# Patient Record
Sex: Female | Born: 1975 | Race: Black or African American | Hispanic: No | Marital: Single | Smoking: Current every day smoker
Health system: Southern US, Community
[De-identification: ages and names within clinical notes are randomized; demographics above are authoritative.]

## PROBLEM LIST (undated history)

## (undated) DIAGNOSIS — I10 Essential (primary) hypertension: Secondary | ICD-10-CM

## (undated) DIAGNOSIS — F17200 Nicotine dependence, unspecified, uncomplicated: Secondary | ICD-10-CM

## (undated) DIAGNOSIS — F1490 Cocaine use, unspecified, uncomplicated: Secondary | ICD-10-CM

## (undated) HISTORY — DX: Essential (primary) hypertension: I10

## (undated) HISTORY — DX: Cocaine use, unspecified, uncomplicated: F14.90

## (undated) HISTORY — DX: Nicotine dependence, unspecified, uncomplicated: F17.200

## (undated) HISTORY — PX: TUBAL LIGATION: SHX77

---

## 1998-01-18 ENCOUNTER — Emergency Department (HOSPITAL_COMMUNITY): Admission: EM | Admit: 1998-01-18 | Discharge: 1998-01-18 | Payer: Self-pay | Admitting: Emergency Medicine

## 1998-06-04 ENCOUNTER — Emergency Department (HOSPITAL_COMMUNITY): Admission: EM | Admit: 1998-06-04 | Discharge: 1998-06-04 | Payer: Self-pay | Admitting: Emergency Medicine

## 1998-11-22 ENCOUNTER — Emergency Department (HOSPITAL_COMMUNITY): Admission: EM | Admit: 1998-11-22 | Discharge: 1998-11-22 | Payer: Self-pay | Admitting: Emergency Medicine

## 1998-11-24 ENCOUNTER — Emergency Department (HOSPITAL_COMMUNITY): Admission: EM | Admit: 1998-11-24 | Discharge: 1998-11-25 | Payer: Self-pay | Admitting: Emergency Medicine

## 1998-11-26 ENCOUNTER — Emergency Department (HOSPITAL_COMMUNITY): Admission: EM | Admit: 1998-11-26 | Discharge: 1998-11-26 | Payer: Self-pay | Admitting: Emergency Medicine

## 1999-02-04 ENCOUNTER — Inpatient Hospital Stay (HOSPITAL_COMMUNITY): Admission: AD | Admit: 1999-02-04 | Discharge: 1999-02-04 | Payer: Self-pay | Admitting: Obstetrics

## 1999-02-12 ENCOUNTER — Other Ambulatory Visit: Admission: RE | Admit: 1999-02-12 | Discharge: 1999-02-12 | Payer: Self-pay | Admitting: Obstetrics and Gynecology

## 1999-04-21 ENCOUNTER — Inpatient Hospital Stay (HOSPITAL_COMMUNITY): Admission: AD | Admit: 1999-04-21 | Discharge: 1999-04-21 | Payer: Self-pay | Admitting: *Deleted

## 1999-05-26 ENCOUNTER — Inpatient Hospital Stay (HOSPITAL_COMMUNITY): Admission: AD | Admit: 1999-05-26 | Discharge: 1999-05-26 | Payer: Self-pay | Admitting: *Deleted

## 1999-06-21 ENCOUNTER — Emergency Department (HOSPITAL_COMMUNITY): Admission: EM | Admit: 1999-06-21 | Discharge: 1999-06-21 | Payer: Self-pay | Admitting: Emergency Medicine

## 1999-07-18 ENCOUNTER — Inpatient Hospital Stay (HOSPITAL_COMMUNITY): Admission: AD | Admit: 1999-07-18 | Discharge: 1999-07-18 | Payer: Self-pay | Admitting: Obstetrics

## 1999-09-15 ENCOUNTER — Inpatient Hospital Stay (HOSPITAL_COMMUNITY): Admission: AD | Admit: 1999-09-15 | Discharge: 1999-09-15 | Payer: Self-pay | Admitting: Obstetrics and Gynecology

## 1999-09-19 ENCOUNTER — Inpatient Hospital Stay (HOSPITAL_COMMUNITY): Admission: AD | Admit: 1999-09-19 | Discharge: 1999-09-19 | Payer: Self-pay | Admitting: Obstetrics & Gynecology

## 1999-09-21 ENCOUNTER — Inpatient Hospital Stay (HOSPITAL_COMMUNITY): Admission: AD | Admit: 1999-09-21 | Discharge: 1999-09-28 | Payer: Self-pay | Admitting: Obstetrics & Gynecology

## 1999-09-26 ENCOUNTER — Encounter: Payer: Self-pay | Admitting: Obstetrics and Gynecology

## 1999-12-03 ENCOUNTER — Encounter: Payer: Self-pay | Admitting: Emergency Medicine

## 1999-12-03 ENCOUNTER — Emergency Department (HOSPITAL_COMMUNITY): Admission: EM | Admit: 1999-12-03 | Discharge: 1999-12-03 | Payer: Self-pay | Admitting: Emergency Medicine

## 2000-02-14 ENCOUNTER — Inpatient Hospital Stay (HOSPITAL_COMMUNITY): Admission: AD | Admit: 2000-02-14 | Discharge: 2000-02-14 | Payer: Self-pay | Admitting: *Deleted

## 2000-03-09 ENCOUNTER — Emergency Department (HOSPITAL_COMMUNITY): Admission: EM | Admit: 2000-03-09 | Discharge: 2000-03-09 | Payer: Self-pay | Admitting: Emergency Medicine

## 2000-07-09 ENCOUNTER — Inpatient Hospital Stay (HOSPITAL_COMMUNITY): Admission: AD | Admit: 2000-07-09 | Discharge: 2000-07-09 | Payer: Self-pay | Admitting: Obstetrics & Gynecology

## 2001-07-06 ENCOUNTER — Inpatient Hospital Stay (HOSPITAL_COMMUNITY): Admission: AD | Admit: 2001-07-06 | Discharge: 2001-07-06 | Payer: Self-pay | Admitting: Obstetrics

## 2001-10-31 ENCOUNTER — Inpatient Hospital Stay (HOSPITAL_COMMUNITY): Admission: AD | Admit: 2001-10-31 | Discharge: 2001-10-31 | Payer: Self-pay | Admitting: Obstetrics

## 2001-11-18 ENCOUNTER — Inpatient Hospital Stay (HOSPITAL_COMMUNITY): Admission: AD | Admit: 2001-11-18 | Discharge: 2001-11-18 | Payer: Self-pay | Admitting: *Deleted

## 2001-12-26 ENCOUNTER — Other Ambulatory Visit: Admission: RE | Admit: 2001-12-26 | Discharge: 2001-12-26 | Payer: Self-pay | Admitting: Gynecology

## 2002-01-09 ENCOUNTER — Inpatient Hospital Stay (HOSPITAL_COMMUNITY): Admission: AD | Admit: 2002-01-09 | Discharge: 2002-01-10 | Payer: Self-pay | Admitting: Obstetrics & Gynecology

## 2002-01-09 ENCOUNTER — Encounter: Payer: Self-pay | Admitting: Obstetrics & Gynecology

## 2002-03-31 ENCOUNTER — Inpatient Hospital Stay (HOSPITAL_COMMUNITY): Admission: AD | Admit: 2002-03-31 | Discharge: 2002-03-31 | Payer: Self-pay | Admitting: Obstetrics and Gynecology

## 2002-04-07 ENCOUNTER — Inpatient Hospital Stay (HOSPITAL_COMMUNITY): Admission: AD | Admit: 2002-04-07 | Discharge: 2002-04-07 | Payer: Self-pay | Admitting: Obstetrics and Gynecology

## 2002-04-07 ENCOUNTER — Encounter: Payer: Self-pay | Admitting: Obstetrics and Gynecology

## 2002-05-16 ENCOUNTER — Observation Stay (HOSPITAL_COMMUNITY): Admission: AD | Admit: 2002-05-16 | Discharge: 2002-05-17 | Payer: Self-pay | Admitting: Obstetrics & Gynecology

## 2002-05-27 ENCOUNTER — Inpatient Hospital Stay (HOSPITAL_COMMUNITY): Admission: AD | Admit: 2002-05-27 | Discharge: 2002-05-27 | Payer: Self-pay | Admitting: Obstetrics & Gynecology

## 2002-06-02 ENCOUNTER — Observation Stay (HOSPITAL_COMMUNITY): Admission: AD | Admit: 2002-06-02 | Discharge: 2002-06-02 | Payer: Self-pay | Admitting: Obstetrics and Gynecology

## 2002-06-11 ENCOUNTER — Inpatient Hospital Stay (HOSPITAL_COMMUNITY): Admission: AD | Admit: 2002-06-11 | Discharge: 2002-06-11 | Payer: Self-pay | Admitting: Obstetrics and Gynecology

## 2002-06-17 ENCOUNTER — Inpatient Hospital Stay (HOSPITAL_COMMUNITY): Admission: AD | Admit: 2002-06-17 | Discharge: 2002-06-17 | Payer: Self-pay | Admitting: Obstetrics and Gynecology

## 2002-06-27 ENCOUNTER — Inpatient Hospital Stay (HOSPITAL_COMMUNITY): Admission: AD | Admit: 2002-06-27 | Discharge: 2002-07-01 | Payer: Self-pay | Admitting: Obstetrics and Gynecology

## 2002-09-06 ENCOUNTER — Encounter: Payer: Self-pay | Admitting: Emergency Medicine

## 2002-09-06 ENCOUNTER — Emergency Department (HOSPITAL_COMMUNITY): Admission: EM | Admit: 2002-09-06 | Discharge: 2002-09-06 | Payer: Self-pay | Admitting: Emergency Medicine

## 2004-11-27 ENCOUNTER — Emergency Department (HOSPITAL_COMMUNITY): Admission: EM | Admit: 2004-11-27 | Discharge: 2004-11-27 | Payer: Self-pay | Admitting: Emergency Medicine

## 2005-03-21 ENCOUNTER — Inpatient Hospital Stay (HOSPITAL_COMMUNITY): Admission: AD | Admit: 2005-03-21 | Discharge: 2005-03-21 | Payer: Self-pay | Admitting: Obstetrics

## 2005-07-05 ENCOUNTER — Inpatient Hospital Stay (HOSPITAL_COMMUNITY): Admission: AD | Admit: 2005-07-05 | Discharge: 2005-07-05 | Payer: Self-pay | Admitting: Obstetrics & Gynecology

## 2005-07-07 ENCOUNTER — Inpatient Hospital Stay (HOSPITAL_COMMUNITY): Admission: AD | Admit: 2005-07-07 | Discharge: 2005-07-07 | Payer: Self-pay | Admitting: *Deleted

## 2005-08-15 ENCOUNTER — Inpatient Hospital Stay (HOSPITAL_COMMUNITY): Admission: AD | Admit: 2005-08-15 | Discharge: 2005-08-15 | Payer: Self-pay | Admitting: *Deleted

## 2005-09-26 ENCOUNTER — Emergency Department (HOSPITAL_COMMUNITY): Admission: EM | Admit: 2005-09-26 | Discharge: 2005-09-26 | Payer: Self-pay | Admitting: Emergency Medicine

## 2006-02-17 ENCOUNTER — Encounter (INDEPENDENT_AMBULATORY_CARE_PROVIDER_SITE_OTHER): Payer: Self-pay | Admitting: Specialist

## 2006-02-17 ENCOUNTER — Ambulatory Visit: Payer: Self-pay | Admitting: Obstetrics & Gynecology

## 2006-05-12 ENCOUNTER — Encounter (INDEPENDENT_AMBULATORY_CARE_PROVIDER_SITE_OTHER): Payer: Self-pay | Admitting: Specialist

## 2006-05-12 ENCOUNTER — Other Ambulatory Visit: Admission: RE | Admit: 2006-05-12 | Discharge: 2006-05-12 | Payer: Self-pay | Admitting: Obstetrics and Gynecology

## 2006-05-12 ENCOUNTER — Ambulatory Visit: Payer: Self-pay | Admitting: Obstetrics and Gynecology

## 2006-05-30 ENCOUNTER — Emergency Department (HOSPITAL_COMMUNITY): Admission: EM | Admit: 2006-05-30 | Discharge: 2006-05-30 | Payer: Self-pay | Admitting: Emergency Medicine

## 2006-06-02 ENCOUNTER — Ambulatory Visit: Payer: Self-pay | Admitting: Obstetrics and Gynecology

## 2006-09-10 ENCOUNTER — Inpatient Hospital Stay (HOSPITAL_COMMUNITY): Admission: AD | Admit: 2006-09-10 | Discharge: 2006-09-10 | Payer: Self-pay | Admitting: Obstetrics & Gynecology

## 2007-02-05 ENCOUNTER — Emergency Department (HOSPITAL_COMMUNITY): Admission: EM | Admit: 2007-02-05 | Discharge: 2007-02-05 | Payer: Self-pay | Admitting: Emergency Medicine

## 2007-06-09 ENCOUNTER — Ambulatory Visit: Payer: Self-pay | Admitting: Obstetrics & Gynecology

## 2007-06-09 ENCOUNTER — Encounter (INDEPENDENT_AMBULATORY_CARE_PROVIDER_SITE_OTHER): Payer: Self-pay | Admitting: Gynecology

## 2007-07-01 ENCOUNTER — Ambulatory Visit (HOSPITAL_BASED_OUTPATIENT_CLINIC_OR_DEPARTMENT_OTHER): Admission: RE | Admit: 2007-07-01 | Discharge: 2007-07-02 | Payer: Self-pay | Admitting: Orthopedic Surgery

## 2007-07-06 ENCOUNTER — Encounter: Admission: RE | Admit: 2007-07-06 | Discharge: 2007-07-27 | Payer: Self-pay | Admitting: Orthopedic Surgery

## 2007-07-28 ENCOUNTER — Ambulatory Visit (HOSPITAL_COMMUNITY): Admission: RE | Admit: 2007-07-28 | Discharge: 2007-07-28 | Payer: Self-pay | Admitting: Obstetrics & Gynecology

## 2007-07-28 ENCOUNTER — Inpatient Hospital Stay (HOSPITAL_COMMUNITY): Admission: AD | Admit: 2007-07-28 | Discharge: 2007-07-28 | Payer: Self-pay | Admitting: Obstetrics and Gynecology

## 2007-07-28 ENCOUNTER — Inpatient Hospital Stay (HOSPITAL_COMMUNITY): Admission: AD | Admit: 2007-07-28 | Discharge: 2007-07-28 | Payer: Self-pay | Admitting: Obstetrics & Gynecology

## 2007-11-02 ENCOUNTER — Inpatient Hospital Stay (HOSPITAL_COMMUNITY): Admission: AD | Admit: 2007-11-02 | Discharge: 2007-11-02 | Payer: Self-pay | Admitting: Obstetrics

## 2008-01-24 ENCOUNTER — Inpatient Hospital Stay (HOSPITAL_COMMUNITY): Admission: AD | Admit: 2008-01-24 | Discharge: 2008-01-26 | Payer: Self-pay | Admitting: Obstetrics

## 2008-01-27 ENCOUNTER — Inpatient Hospital Stay (HOSPITAL_COMMUNITY): Admission: AD | Admit: 2008-01-27 | Discharge: 2008-01-27 | Payer: Self-pay | Admitting: Obstetrics

## 2008-02-22 ENCOUNTER — Inpatient Hospital Stay (HOSPITAL_COMMUNITY): Admission: AD | Admit: 2008-02-22 | Discharge: 2008-02-26 | Payer: Self-pay | Admitting: Obstetrics

## 2008-02-23 ENCOUNTER — Encounter (INDEPENDENT_AMBULATORY_CARE_PROVIDER_SITE_OTHER): Payer: Self-pay | Admitting: Obstetrics

## 2010-09-26 ENCOUNTER — Emergency Department (HOSPITAL_COMMUNITY)
Admission: EM | Admit: 2010-09-26 | Discharge: 2010-09-27 | Payer: Self-pay | Source: Home / Self Care | Admitting: Emergency Medicine

## 2010-11-02 ENCOUNTER — Encounter: Payer: Self-pay | Admitting: Obstetrics

## 2010-11-02 ENCOUNTER — Encounter: Payer: Self-pay | Admitting: Obstetrics & Gynecology

## 2011-02-24 NOTE — H&P (Signed)
[name], [name]               ACCOUNT NO.:  [id number]   MEDICAL RECORD NO.:  [id number]          PATIENT TYPE:  INP   LOCATION:  9371                          FACILITY:  [facility]   PHYSICIAN:  Kathreen Cosier, M.D.DATE OF BIRTH:  Feb 21, 1976   DATE OF ADMISSION:  02/22/2008  DATE OF DISCHARGE:                              HISTORY & PHYSICAL   HISTORY OF PRESENT ILLNESS:  The patient is a 35 year old gravida 3,  para 2-0-0-2, had two previous C-sections.  EDC was March 20, 2008.  The  patient was 36 weeks and 4 days, who came in complaining of a headache.  Her blood pressures were elevated, has a diastolic 101.  The patient had  a history of preeclampsia, in the past both pregnancies, and in this  pregnancy she was placed on Aldomet 500 mg p.o. b.i.d., but the patient  states that she had not been taking the medication, and she came in with  a headache and elevated blood pressures.  She was started on magnesium  sulfate, 4 g loading, 2 g an hour.  Her urine protein was 100.  Her PIH  labs were normal.  Her sodium was 138, potassium on admission was 2.6,  chloride 106, glucose 97, BUN less than 1, and creatinine 0.47 and her  SGOT and SGPT normal.  LDH 153.  Platelets 288 and hemoglobin 9.9.  Uric  acid 6.1.  Overnight, the repeat labs on Feb 23, 2008 were essentially  unchanged, and her magnesium was discontinued, but her blood pressures  became elevated once again, so it  was decided she would be delivered by  repeat section.  The patient also desires of a tubal ligation.   PHYSICAL EXAM:  GENERAL:  Revealed a well-developed female in no  distress.  HEENT:  Negative.  LUNGS:  Clear.  HEART:  Regular rhythm.  No murmurs or gallops.  BREASTS:  No masses.  ABDOMEN:  36 weeks size.  Estimated fetal weight 6-1/2 pounds.  EXTREMITIES:  Negative.   ADMISSION DIAGNOSES:  1. Pregnancy-induced hypertension.  2. Chronic hypertension.           ______________________________  Kathreen Cosier, M.D.     BAM/MEDQ  D:  02/23/2008  T:  02/24/2008  Job:  [id number]

## 2011-02-24 NOTE — Op Note (Signed)
NAMEAMARISS, DETAMORE               ACCOUNT NO.:  [id number]   MEDICAL RECORD NO.:  [id number]          PATIENT TYPE:  AMB   LOCATION:  DSC                          FACILITY:  [facility]   PHYSICIAN:  Dyke Brackett, M.D.    DATE OF BIRTH:  1976-08-29   DATE OF PROCEDURE:  07/01/2007  DATE OF DISCHARGE:                               OPERATIVE REPORT   INDICATIONS:  She is a 35 year old female who was followed elsewhere  with previous surgery. MRI proven lateral meniscus tear with ACL  insufficiency thought to be amenable to outpatient surgery.   PREOPERATIVE DIAGNOSES:  1. Large double bucket-handle tear lateral meniscus.  2. Chondromalacia medial lateral patellofemoral joint.  3. Chronic anterior cruciate ligament insufficiency.   POSTOPERATIVE DIAGNOSES:  1. Large double bucket-handle tear lateral meniscus.  2. Chondromalacia medial lateral patellofemoral joint.  3. Chronic anterior cruciate ligament insufficiency.   OPERATION:  1. Bone-tendon-bone allograft ACL reconstruction.  2. Arthroscopic meniscectomy.  3. Arthroscopic debridement chondroplasty x2.   SURGEON:  Dr. Madelon Lips.   ANESTHESIA:  General with a nerve block with some local supplementation.   DESCRIPTION OF PROCEDURE:  Arthroscopic portals created inferomedial,  inferolateral and essentially went through the patellar tendon for  placement of the ACL screw. Systematic inspection of the knee showed the  patient to have mild-to-moderate changes of chondromalacia,  tricompartmental, which were debrided most noticeably in the lateral  compartment, previous partial probably 50% meniscectomy on the medial  aspect of the knee and a double bucket-handle tear of the lateral  requiring resection of probably 80-90% of the lateral meniscus notch  showed moderate overgrowth, notchplasty was carried out. A 100-105 mm  bone-tendon-bone allograft was prepared at the back table. We had a  notchplasty carried out followed by placement  of the tibial guide pin  near the anatomic attachment of the ACL reference of the PCL with the  Arthrex system.  This was followed by placement of a guide pin using the  Arthrex system through the transtibial hole with a guide pin placed at  about the 1 o'clock position of the posterior femur for the left knee.  A footprint was created followed by over reaming with a 10-mm reamer as  well as tibia and on the femur and a notch had been placed in the hole  relative to a replacement guide pin on the femur.  Graft was applied  from tibial to femoral side without difficulty, good snug fit was  obtained, the patient's bone was moderately soft, the plug of the donor  very hard bone. Good press fit was obtained with a 7 x 25 mm  screw placed on each side.  Again no impingement noted, excellent  stability allotment which was definitely 4+ and was traced post  insertion of the graft and the screws. The wounds were closed with 2-0  Vicryl and skin with interrupted Vicryl, Marcaine with epinephrine  infiltrated in  the skin.  Taken tr recovery room in stable condition.      Dyke Brackett, M.D.  Electronically Signed     WDC/MEDQ  D:  07/01/2007  T:  07/02/2007  Job:  [id number]

## 2011-02-24 NOTE — Group Therapy Note (Signed)
[name], [name]               ACCOUNT NO.:  [id number]   MEDICAL RECORD NO.:  [id number]         PATIENT TYPE:  WOC   LOCATION:  [facility]                   FACILITY:  [facility]   PHYSICIAN:  Johnella Moloney, MD        DATE OF BIRTH:  1975-10-26   DATE OF SERVICE:                                  CLINIC NOTE   CHIEF COMPLAINT:  Yearly exam, itching in vaginal area.   HISTORY OF PRESENT ILLNESS:  The patient is a 35 year old G5 P2 0 3 2,  who presents for her annual exam.  The patient also complains of itching  in her vaginal area.  She says that she noticed itching two weeks ago.  It is mostly external, not associated with any rashes or discharge or  foul odor.  She just wanted to see if there was anything that was  abnormal.  The patient also reported having one episode of dyspareunia  recently that lasted for 15 to 20 minutes, but no other associated  symptoms.  Of note, the patient's last menstrual period was April 26, 2007, and she is also requesting a urine pregnancy test.   PAST OB HISTORY:  G5 P2 0 3 2, 2 cesarean sections, 2 abortions and 1  miscarriage.   PAST GYN HISTORY:  Normal menstrual periods.  History of cervical  dysplasia with ascus positive HPV Pap smear in May 2007 and colposcopy  that showed low-grade squamous intraepithelial lesion.  The patient also  has a history of gonorrhea and Chlamydia, which she says is about in  1998 and she and her partner were treated.   PAST MEDICAL HISTORY:  None.   PAST SURGICAL HISTORY:  None.   MEDICATIONS:  None.   ALLERGIES:  None.   SOCIAL HISTORY:  The patient works as a [employer] manager.  Lives  with her children and also with her partner.  She smokes half a pack per  day since age 41.  Denies alcohol or illicit drug use.   FAMILY HISTORY:  Noncontributory.   14-POINT REVIEW OF SYSTEMS:  Patient does report having occasional  headaches and has noticed that she has high blood pressures.  She will  follow up  with her primary care Abbey Veith for further evaluation and  management of these symptoms.   PHYSICAL EXAMINATION:  VITAL SIGNS:  Temperature 99.5, pulse 75, blood  pressure 145/104.  Recheck was 142/97, weight 161 pounds, height 5 7.  GENERAL:  No apparent distress.  LUNGS:  Clear to auscultation bilaterally.  HEART:  Regular rate and rhythm.  ABDOMEN:  Soft, nontender, nondistended.  PELVIC EXAMINATION:  Normal external female genitalia.  No vulvar  lesions or abnormal discharge noted.  Normal vaginal discharge with  clear normal rugae.  Cervix:  Normal contour.  Retroverted uterus of  normal size and mobile.  Nontender.  No abnormal adnexal masses.  Sample  obtained for Pap smear, G6 Chlamydia and wet prep.   ASSESSMENT AND PLAN:  The patient is a 35 year old female who came here  for annual exam.  Will follow up Pap smear, given her history of  cervical dysplasia  and she might need a colposcopy if the results come  back abnormal.  The patient is also to get a urine hCG at this visit.  Will follow up the results for that and will also follow up her wet prep  results.  The patient will follow up with her PCP for evaluation and  management of her high blood pressure and recurrent headaches.           ______________________________  Johnella Moloney, MD     UD/MEDQ  D:  06/09/2007  T:  06/10/2007  Job:  [id number]

## 2011-02-24 NOTE — Discharge Summary (Signed)
[name], [name]               ACCOUNT NO.:  [id number]   MEDICAL RECORD NO.:  [id number]          PATIENT TYPE:  INP   LOCATION:  9371                          [facility]   PHYSICIAN:  Kathreen Cosier, M.D.DATE OF BIRTH:  [birth year]   DATE OF ADMISSION:  02/22/2008  DATE OF DISCHARGE:  02/26/2008                               DISCHARGE SUMMARY   The patient is a 35 year old gravida 3, para 2-0-0-2, [facility] March 20, 2008,  came in with a headache.  She had a history of preeclampsia with all of  her pregnancies and she had been on Aldomet 500 p.o. b.i.d. but she had  not been taking the medication.  On admission, her diastolic blood  pressure was 101.  She was started on magnesium sulfate 4 grams loading  2 grams an hour.  Urine protein was 100.  PIH labs were normal.  Potassium on admission was 2.6.  She was started on 40 mEq of KCl each  IV.  Chloride 106, glucose 97.  SGOT and SGPT were normal.  Platelets  288, hemoglobin 9.9.  Uric acid 6.1.  The next day, her magnesium  sulfate was discontinued.  Blood pressures became elevated once again  and she was taken to the operating room and had a repeat low-transverse  cesarean section and tubal ligation.  She had a female, Apgars of 9 and 10  with a weight of 6 pounds 2 ounces.  Postop, she was started on  labetalol p.o. b.i.d. and by postop day #2, blood pressure was normal,  magnesium was discontinued, and she was continued on her potassium  because of hyperkalemia.  By day #3, potassium was 3.1.  She was  discharged on K-Dur 20 p.o. b.i.d., Tylox for pain, and labetalol 200  p.o. b.i.d. and to see me in a week for blood pressure check.   DISCHARGE DIAGNOSES:  1. History of chronic hypertension, untreated.  The patient did not      take her medication.  2. Ruptured membranes.  3. Pregnancy-induced hypertension.           ______________________________  Kathreen Cosier, M.D.     BAM/MEDQ  D:  03/21/2008  T:   03/21/2008  Job:  [id number]

## 2011-02-24 NOTE — Op Note (Signed)
[name], [name]               ACCOUNT NO.:  [id number]   MEDICAL RECORD NO.:  [id number]          PATIENT TYPE:  INP   LOCATION:  9371                          [facility]   PHYSICIAN:  Kathreen Cosier, M.D.DATE OF BIRTH:  [birth year]   DATE OF PROCEDURE:  02/23/2008  DATE OF DISCHARGE:                               OPERATIVE REPORT   PREOPERATIVE DIAGNOSES:  1. Pregnancy-induced hypertension.  2. Chronic hypertension.  3. Previous C-section, multiparity.   POSTOPERATIVE DIAGNOSES:  1. Pregnancy-induced hypertension.  2. Chronic hypertension.  3. Previous C-section, multiparity.   ANESTHESIA:  Spinal.   PROCEDURE:  The patient was placed in the supine position.  After the  spinal administered, abdomen prepped and draped.  Bladder emptied with  Foley catheter.  Transverse suprapubic incision made through the old  scar, carried down to her rectus fascia.  Fascia cleaned and incised to  the length of the incision.  Rectus muscle was retracted laterally.  The  peritoneum was incised longitudinally.  Transverse incision made on the  visceral peritoneum, above the bladder.  Bladder mobilized inferiorly.  Transverse lower uterine incision made.  The fluid was clear.  The  patient was delivered of a female, Apgar 9 and 10, weighing 6 pounds 2  ounces.  From the LOA position, there was nuchal cord x1.  The placenta  was removed manually and sent to pathology.  Uterine cavity cleaned with  dry lap.  The uterine incision closed in one layer with continuous  suture of #1 chromic.  Hemostasis was satisfactory.  Uterus well  contracted.  The right tube was grasped in the midportion with a Babcock  clamp, 0 plain suture placed in the mesosalpinx, below the portion of  tube and the clamp.  Approximately 1 inch of tube was transected.  The  procedure was done in a similar fashion on the other side.  Hemostasis  was satisfactory.  Abdomen closed in layers; peritoneum, continuous  suture of 0 chromic; and fascia, continuous suture of 0 Dexon.  Skin  closed with subcuticular stitch of 4-0 Monocryl.  Blood loss was 500 mL.           ______________________________  Kathreen Cosier, M.D.     BAM/MEDQ  D:  02/23/2008  T:  02/24/2008  Job:  [id number]

## 2011-02-27 NOTE — Discharge Summary (Signed)
[name], [name]                           ACCOUNT NO.:  [id number]   MEDICAL RECORD NO.:  [id number]                   PATIENT TYPE:  INP   LOCATION:  9128                                 [facility]   PHYSICIAN:  Randye Lobo, M.D.                DATE OF BIRTH:  11-Jun-1976   DATE OF ADMISSION:  06/27/2002  DATE OF DISCHARGE:  07/01/2002                                 DISCHARGE SUMMARY   FINAL DIAGNOSES:  1. Intrauterine pregnancy at [redacted] weeks gestation.  2. History of previous cesarean section and desires repeat cesarean section.   PROCEDURE:  Low transverse cesarean section.   SURGEON:  Carrington Clamp, M.D.   ASSISTANT:  Miguel Aschoff, M.D.   COMPLICATIONS:  None.   HISTORY:  This 35 year old G2, P1 presents at 39+ weeks gestation for repeat  cesarean section.  The patient had a prior cesarean section secondary to  failure to progress and does not desire a VBAC at this time.  The patient  admitted.  She was taken to the operating room by Dr. Carrington Clamp where  a primary low transverse cesarean section was performed with the delivery of  an 8 pound 5 ounce female infant with Apgars of 9 and 9.  Delivery went  without complications.  The patient's postoperative course was benign  without significant fevers.  She did have some moderate to heavy  postoperative bleeding which was treated with fundal massage, IV Pitocin and  Methergine and Cytotec.  Bleeding did slow significantly with this.  The  patient's hemoglobin did drop to 6.0 on postoperative day number one and by  postoperative day number two was down to 5.1.  The patient was started on  iron 325 mg one t.i.d.  The little boy was circumcised during her hospital  stay.  By postoperative day number three patient was ambulating some.  She  did have some hypokalemia and she was continued on her iron.  On  postoperative day number three patient started developing some headache and  Dr. Edward Jolly was on-call and was  notified of slightly elevated blood pressures  in the 140s/90s range.  The patient was still afebrile at this time and  lungs were clear.  PIH laboratories were changed and a transfusion with 2  units of packed red blood cells was performed.  The patient did not have any  problems with the transfusion and by postoperative day number four was  feeling better.  She was not having any shortness of breath and her  hemoglobin was already back up to 7.2.  The patient was felt ready for  discharge on postoperative day number four.  Was sent home on a regular  diet.  Told to decrease activities.  Told to continue prenatal vitamins and  FeSo4 325 mg one t.i.d.  Was sent home with Percocet one to two q.4h. as  needed for pain.  Told  she could use over-the-counter ibuprofen as needed.  Was to follow up in the office in one week to recheck blood pressures.  She  was still having some high normal blood pressures and was to call with any  increase in pain, fever, or dizziness.     Meredith Able, PA-C                   Randye Lobo, M.D.    MB/MEDQ  D:  07/31/2002  T:  07/31/2002  Job:  [id number]

## 2011-02-27 NOTE — Group Therapy Note (Signed)
NAMEMARKEESHA, Powell               ACCOUNT NO.:  [id number]   MEDICAL RECORD NO.:  [id number]          PATIENT TYPE:  WOC   LOCATION:  [facility]                   FACILITY:  [facility]   PHYSICIAN:  Meredith Donovan, MD        DATE OF BIRTH:  [birth year]   DATE OF SERVICE:  06/02/2006                                    CLINIC NOTE   HISTORY:  This is a 35 year old G3, P2-0-1-2 who is now 3 weeks past her  colposcopy and is here for results.  She had an ASCUS and positive for high-  risk HPV which was done Feb 17, 2006.  At that point she had a full GYN  workup with Dr. Penne Lash.  On May 12, 2006, she had her colposcopy,  cervical biopsy, and the pathology shows low-grade squamous intraepithelial  lesion.  The EC curettings were insufficient for diagnosis.  On further  discussion with the patient, she is concerned about having it positive for  HPV and would like to be checked for all STDs.  Of note, she has had GC and  chlamydia positive in the past and has had the same single partner for the  past 10 years.  Of note, she declines any kind of birth control though she  does not desire pregnancy.  She wants to use condoms for now after much  discussion about all the optional methods.  In her case, she is a smoker  with mild chronic hypertension.  Since her colonoscopy she has had no  problems.  She did have a period which started about a week ago and ended 3  days ago.  She does still smoke and is not interested in begin referred to a  smoking cessation program.  She has cut down on her own in the past and  would like to try to do this again.   EXAMINATION:  VITAL SIGNS:  Apparently not done today.  PELVIC:  NEFG.  Vagina:  Clear, normal rugae, scant white discharge.  Cervix:  Well healed.  GC and chlamydia done.  Uterus:  NSSP, nontender.  Adnexa:  Without tenderness or masses.   ASSESSMENT:  At risk for abnormal cervical cytology.   PLAN:  The patient is counseled on STDs and on the high-risk  HPV, as well as  on all methods of birth control that do not involve estrogen.  She is going  to get an HIV and RPR today and she will be coming back in 5-6 months for  her followup Pap smear.     ______________________________  [name], CNM    ______________________________  Meredith Donovan, MD    DP/MEDQ  D:  06/02/2006  T:  06/03/2006  Job:  [id number]

## 2011-02-27 NOTE — Op Note (Signed)
[name], [name]                           ACCOUNT NO.:  [id number]   MEDICAL RECORD NO.:  [id number]                   PATIENT TYPE:  INP   LOCATION:  NA                                   [facility]   PHYSICIAN:  Carrington Clamp, M.D.              DATE OF BIRTH:  1975-11-25   DATE OF PROCEDURE:  06/27/2002  DATE OF DISCHARGE:                                 OPERATIVE REPORT   PREOPERATIVE DIAGNOSES:  At 39+ weeks with previous cesarean section,  desires repeat cesarean section.   POSTOPERATIVE DIAGNOSES:  At 39+ weeks with previous cesarean section,  desires repeat cesarean section.   PROCEDURE:  Low transverse cesarean section.   SURGEON:  Carrington Clamp, M.D.   ASSISTANT:  Miguel Aschoff, M.D.   ANESTHESIA:  Spinal.   ESTIMATED BLOOD LOSS:  500 cc.   IV FLUIDS:  3000 cc.   URINE OUTPUT:  350 cc.   COMPLICATIONS:  None.   FINDINGS:  Female infant in the vertex presentation.  Apgars 9/9.  Normal  tubes, uterus, and ovaries seen.   MEDICATIONS:  Cefotan and Pitocin.   PATHOLOGY:  None.   COUNTS:  Correct x3.   REASON FOR OPERATION:  The patient is a G2, P1-0-0-1 at full-term with EDC  of July 02, 2002 who strongly desired a repeat cesarean section rather  than a vaginal trial of labor.   TECHNIQUE:  After adequate spinal anesthesia was achieved the patient was  prepped and draped in the usual sterile fashion in dorsal supine position  with leftward tilt.  Pfannenstiel skin incision was made through the prior  incision and carried down to the fascia with Bovie cautery.  The fascia was  incised in the midline with the scalpel and carried in a transverse  curvilinear manner with the Mayo scissors.  The fascia was reflected  superiorly and inferiorly from the rectus muscles __________ peritoneum was  entered into bluntly.  Peritoneum was incised in a superior and inferior  manner with Metzenbaum scissors with good visualization of the bowel and the  bladder.   Bladder blade was placed and the vesicouterine fascia tented up and incised  in a transverse curvilinear manner.  The bladder was adherent to the lower  uterine segment so only a small __________ dissection could be undertaken.  However, the upper portion of the lower uterine segment was clear of the  bladder.  The bladder blade was replaced and a 2 cm transverse incision was  made with the scalpel until the amniotomy was entered and clear fluid noted.  The bandage scissors was used to incise in a transverse curvilinear manner  and the baby was identified in the vertex position and delivered.  The baby  was bulb suctioned.  Cord was clamped and cut and the cord blood was  obtained.  Baby was handed to waiting pediatrics.   The placenta was delivered manually and  the uterus exteriorized, wrapped in  a wet lap, and cleared of all debris.  The uterine incision was closed with  a running locked stitch of 0 Monocryl.  Hemostasis was achieved.  The uterus  was replaced in the abdominal cavity.  The gutters were cleared of debris  with irrigation and the uterine incision reinspected, found to be  hemostatic.  Peritoneum was then closed with a running stitch of 2-0 Vicryl.  The fascia was closed with a running stitch of 0 Vicryl.  The subcutaneous  tissue was rendered hemostatic with the Bovie cautery and irrigation and the  skin was closed with staples.  The patient tolerated procedure well.  Was  returned to the recovery room in stable condition.                                               Carrington Clamp, M.D.    MH/MEDQ  D:  06/27/2002  T:  06/27/2002  Job:  [id number]

## 2011-02-27 NOTE — Group Therapy Note (Signed)
[name], [name] Powell               ACCOUNT NO.:  [id number]   MEDICAL RECORD NO.:  [id number]          PATIENT TYPE:  WOC   LOCATION:  [facility]                   FACILITY:  [facility]   PHYSICIAN:  Elsie Lincoln, MD      DATE OF BIRTH:  November 15, 1975   DATE OF SERVICE:                                    CLINIC NOTE   The patient is a 35 year old, G6, para 2-0-4-2, LMP January 31, 2006 who  presents for her yearly physical exam and Pap smear.  The patient previously  got care at [facility].  She changed due to difficulty scheduling appointments.  She states that her only complaint right now is some vaginal irritation that  has been present for a week.  It is itchy.  She has not noticed any change  in discharge.  She has not complaining of any lesion.  She douches  approximately once a month and douches at the end of her menses, which was  approximately a week ago.  This could be the reason why she has vaginal  irritation.  Her last Pap smear was 2 years ago.  She is sexually active  with 1 partner and does not using the birth control.  She does not wish to  become pregnant again and would like to use the Depo for contraception.   PAST GYNECOLOGICAL HISTORY:  She has 2 C-sections, 3 TOPs and 1 SAB.  She  has had gonorrhea and Chlamydia in the past.  She has had abnormal Pap  smears in her teens the required cryo.  She has had normal Pap smears since  then.   PAST MEDICAL HISTORY:  Hypertension.  Blood pressure today on the Dinamap  was 149/101.  On manual recheck, it was 130/86.   PAST SURGICAL HISTORY:  Arthroscopic left knee surgery.   PAST SOCIAL HISTORY:  One half pack per day tobacco.  No alcohol or drugs.   FAMILY HISTORY:  Mom with hypertension, grandparents have diabetes and  grandparents have had heart attacks.   MEDICATIONS:  Tylenol p.m. p.r.n.   ALLERGIES:  DENIES ALL ALLERGIES.   REVIEW OF SYMPTOMS:  Frequent headaches and vaginal itching.   PHYSICAL EXAMINATION:  VITAL  SIGNS:  Temperature 99.3, pulse 91, blood  pressure 130/86, weight 167, height 5 feet 5.5 inches.  GENERAL:  Well-nourished, well-developed in no apparent distress.  HEENT:  Good dentition.  Normocephalic, atraumatic.  NECK:  Supple.  No masses.  Normal thyroid.  LUNGS:  Clear to auscultation bilaterally.  HEART:  Regular rate and rhythm.  BREASTS:  No masses.  No nipple discharge.  No skin changes.  No  lymphadenopathy.  ABDOMEN:  Soft, nontender, no rebound, no guarding, no organomegaly and no  hernia.  No lymphadenopathy.  GENITALIA:  Tanner V.  No evidence of external vulvar irritation.  No  lesions. Vagina pink.  Normal rugae.  Urethra no prolapse and urethra  nontender.  Bladder nontender.  Uterus could support. Cervix closed and  nontender.  Uterus anteverted and nontender, normal size and contour.  Adnexa, no masses and nontender.  Both ovaries palpated.  Perineum intact.  RECTUM:  No hemorrhoids.  EXTREMITIES:  No edema.   ASSESSMENT/PLAN:  A 35 year old female with normal GYN exam.  1.  Borderline hypertension.  2.  The patient was Depo-Provera.  ECG today.  Protected sex for 2 weeks      with condoms and repeat ECG in 2 weeks, and then Depo.  3.  Quit assist program guide given for quit smoking.  4.  Repeat blood pressure checks when she comes back.  If hypertension      diagnosed, then consider referral to primary care doctor.  5.  Return for Depo.  6.  Stop douching and see if vaginal irritation goes away.           ______________________________  Elsie Lincoln, MD     KL/MEDQ  D:  02/17/2006  T:  02/18/2006  Job:  [id number]

## 2011-02-27 NOTE — Op Note (Signed)
[facility]  PatientPA Powell                         MRN: [id number] Proc. Date: 09/22/99 Adm. Date:  [id number] Attending:  Mickle Mallory                           Operative Report  PREOPERATIVE DIAGNOSES:       1. Intrauterine pregnancy at 39 weeks.                               2. Preeclampsia.                               3. Arrest and dilatation.  POSTOPERATIVE DIAGNOSES:      1. Intrauterine pregnancy at 39 weeks.                               2. Preeclampsia.                               3. Arrest and dilatation.  OPERATION:                    Primary low transverse cesarean section.  SURGEON:                      Dr. Suzanne Boron  ANESTHESIA:                   Epidural  ESTIMATED BLOOD LOSS:         500 cc  FINDINGS:                     A vigorous female infant in OP position weighing  pounds and 8 ounces.  DESCRIPTION OF PROCEDURE:     The patient was taken to the operating room where she was given epidural anesthesia.  She was then placed in the supine position with a dorsal leftward tilt.  Her abdomen was prepped and draped in the usual sterile fashion.  A Foley catheter was already in the bladder.  Using a scalpel a low transverse incision was made with the scalpel and carried down to the fascia using electrocautery with good hemostasis.  The fascia was scored in the midline and extended laterally using Mayo scissors.  A Pfannenstiel incision was created by  dissecting the rectus muscles away superiorly and then inferiorly with good hemostasis.  The midline was identified, the rectus muscles were separated and  peritoneal incision was then made with Metzenbaum scissors.  The peritoneal incision was then extended superiorly and then inferiorly with good visualization of the bladder.  The bladder blade was inserted and the lower uterine segment was identified and the bladder flap was created sharply and then  digitally.  The bladder blade was then readjusted.  Using a scalpel, a low transverse incision was made in the uterus nd the incision was extended laterally.  The amniotic fluid was noted to be clear.  The infant was in occiput posterior position and was delivered atraumatically.  The baby was a vigorous female with  Apgars of 8 at 1 minute and 9  at 5 minutes.  The cord was clamped and cut and the baby was handed to the waiting pediatrician.  The placenta was removed after cord blood was obtained and noted to be intact and normal in appearance.  The uterus was cleared of all clots and debris.  The uterine incision was closed in a single layer using 0 chromic in a continuous running locked stitch.  The uterine incision was noted to be hemostatic.  The adnexa were palpated and noted to be normal.  The right ovary was slightly enlarged but appeared normal nd there was no cyst seen.  The peritoneum was closed in a single layer using 0 Vicryl in a continuous running stitch and the rectus muscles were reapproximated using the same 0 Vicryl.  The rectus muscles were noted to be hemostatic.  The fascia was  closed using 0 Vicryl in a continuous running stitch x 2 starting at each corner and meeting in the midline.  After irrigation of the subcutaneous layer and noting that it was hemostatic the skin was closed with staples.  All sponge, lap and instrument counts were correct x 2.  Patient tolerated the procedure well and went to the recovery room in stable condition.  COMPLICATIONS: None.  PATHOLOGY:  None.  DRAINS:  Foley. DD:  09/22/99 TD:  09/23/99 Job: [id number]

## 2011-02-27 NOTE — Discharge Summary (Signed)
[facility]  Patient:    [name], [name] Visit Number: [id number] MRN: [id number]          Service Type: OBS Location: 910A 9107 01 Attending Physician:  Mickle Mallory Dictated by:   Janeece Riggers Dareen Piano, M.D. Admit Date:  01/08/2002 Disc. Date: 01/10/02                             Discharge Summary  PRINCIPAL DISCHARGE DIAGNOSES: 1. Intrauterine pregnancy at 15 weeks. 2. Abdominal pain.  PRINCIPAL PROCEDURES: 1. Intravenous antibiotic therapy. 2. Obstetrical ultrasound.  HISTORY OF PRESENT ILLNESS:  Ms. Vastine is a 35 year old black female, G2, P1, with a history of a previous cesarean section.  She presented complaining of a three day history of worsening abdominal pain.  Approximately 12 hours prior to admission the pain had exacerbated.  The patient had been seen in the office one week prior to admission, and diagnosed with bacterial vaginitis. The patient was treated with MetroGel.  On admission, the patient also complained of mild low back pain.  She denied any diarrhea, constipation, vaginal discharge, or urinary tract infection symptoms.  HOSPITAL COURSE:  On admission, the patients temperature was 100.1, and she was noted to have normal white count, however, there was a left shift.  Her urinalysis was normal except for ketonuria.  The patient had chlamydia, gonorrhea, and Group B Strep cultures obtained.  She was admitted and begun on Unasyn for possible chorioamnionitis.  The patients cultures subsequently returned all negative, and her CBC returned also to normal.  The patient had an ultrasound which was normal.  The patient was discharged to home on 01/10/02.  FOLLOWUP:  In the office in one week.  DISCHARGE MEDICATIONS:  Keflex 500 mg q.i.d. x5 days.Dictated by:   Janeece Riggers Dareen Piano, M.D. Attending Physician:  Mickle Mallory DD:  01/10/02 TD:  01/10/02 Job: [id number]

## 2011-07-03 LAB — URINALYSIS, ROUTINE W REFLEX MICROSCOPIC
Bilirubin Urine: NEGATIVE
Glucose, UA: NEGATIVE
Hgb urine dipstick: NEGATIVE
Ketones, ur: NEGATIVE
Nitrite: NEGATIVE
Protein, ur: NEGATIVE
Specific Gravity, Urine: 1.02
Urobilinogen, UA: 0.2
pH: 7

## 2011-07-03 LAB — URINE MICROSCOPIC-ADD ON

## 2011-07-07 LAB — BASIC METABOLIC PANEL
CO2: 22
CO2: 24
Calcium: 7.2 — ABNORMAL LOW
Calcium: 8 — ABNORMAL LOW
Chloride: 107
Creatinine, Ser: 0.46
GFR calc Af Amer: >60
GFR calc Af Amer: >60
GFR calc non Af Amer: >60
Potassium: 2.8 — ABNORMAL LOW
Sodium: 138
Sodium: 139

## 2011-07-07 LAB — COMPREHENSIVE METABOLIC PANEL
ALT: 23
AST: 21
Albumin: 3 — ABNORMAL LOW
Alkaline Phosphatase: 67
BUN: 1 — ABNORMAL LOW
CO2: 23
Calcium: 8.6
Chloride: 105
Creatinine, Ser: 0.43
GFR calc Af Amer: >60
GFR calc non Af Amer: >60
Glucose, Bld: 85
Potassium: 2.6 — CL
Sodium: 136
Total Bilirubin: 0.4
Total Protein: 6.2

## 2011-07-07 LAB — CBC
HCT: 32.6 — ABNORMAL LOW
Hemoglobin: 11.2 — ABNORMAL LOW
MCHC: 34.3
MCV: 96.1
Platelets: 231
RBC: 3.39 — ABNORMAL LOW
RDW: 14.1
WBC: 10.6 — ABNORMAL HIGH

## 2011-07-07 LAB — URINALYSIS, ROUTINE W REFLEX MICROSCOPIC
Bilirubin Urine: NEGATIVE
Glucose, UA: NEGATIVE
Hgb urine dipstick: NEGATIVE
Ketones, ur: NEGATIVE
Nitrite: NEGATIVE
Nitrite: NEGATIVE
Protein, ur: 30 — AB
Specific Gravity, Urine: 1.01
Specific Gravity, Urine: 1.01
Urobilinogen, UA: 0.2
Urobilinogen, UA: 0.2
pH: 6.5
pH: 8

## 2011-07-07 LAB — URINE MICROSCOPIC-ADD ON

## 2011-07-07 LAB — LACTATE DEHYDROGENASE: LDH: 160

## 2011-07-07 LAB — URIC ACID: Uric Acid, Serum: 5

## 2011-07-08 LAB — CBC
MCHC: 35.2
Platelets: 231
RDW: 14

## 2011-07-08 LAB — COMPREHENSIVE METABOLIC PANEL
ALT: 12
AST: 21
AST: 21
Albumin: 2.3 — ABNORMAL LOW
Albumin: 2.3 — ABNORMAL LOW
Calcium: 7 — ABNORMAL LOW
Chloride: 103
Creatinine, Ser: 0.5
GFR calc Af Amer: >60
GFR calc Af Amer: >60
Glucose, Bld: 108 — ABNORMAL HIGH
Sodium: 137
Sodium: 138
Total Bilirubin: 0.5
Total Protein: 4.8 — ABNORMAL LOW

## 2011-07-08 LAB — URIC ACID: Uric Acid, Serum: 5.9

## 2011-07-08 LAB — BASIC METABOLIC PANEL
BUN: 3 — ABNORMAL LOW
Chloride: 105
GFR calc non Af Amer: >60
Glucose, Bld: 81
Potassium: 3.1 — ABNORMAL LOW

## 2011-07-08 LAB — LACTATE DEHYDROGENASE: LDH: 164

## 2011-07-15 ENCOUNTER — Emergency Department (HOSPITAL_COMMUNITY): Payer: Self-pay

## 2011-07-15 ENCOUNTER — Emergency Department (HOSPITAL_COMMUNITY)
Admission: EM | Admit: 2011-07-15 | Discharge: 2011-07-15 | Disposition: A | Discharge status: Home / Self Care | Payer: Self-pay | Attending: Emergency Medicine | Admitting: Emergency Medicine

## 2011-07-15 DIAGNOSIS — I10 Essential (primary) hypertension: Secondary | ICD-10-CM | POA: Insufficient documentation

## 2011-07-15 DIAGNOSIS — R42 Dizziness and giddiness: Secondary | ICD-10-CM | POA: Insufficient documentation

## 2011-07-15 DIAGNOSIS — R079 Chest pain, unspecified: Secondary | ICD-10-CM | POA: Insufficient documentation

## 2011-07-15 LAB — DIFFERENTIAL
Eosinophils Relative: 2 % (ref 0–5)
Lymphocytes Relative: 32 % (ref 12–46)
Lymphs Abs: 2.2 10*3/uL (ref 0.7–4.0)
Monocytes Absolute: 0.5 10*3/uL (ref 0.1–1.0)
Monocytes Relative: 7 % (ref 3–12)

## 2011-07-15 LAB — CBC
HCT: 37.5 % (ref 36.0–46.0)
MCHC: 33.9 g/dL (ref 30.0–36.0)
MCV: 94.2 fL (ref 78.0–100.0)
RDW: 12.9 % (ref 11.5–15.5)

## 2011-07-15 LAB — POCT I-STAT, CHEM 8
BUN: 15 mg/dL (ref 6–23)
Calcium, Ion: 1.24 mmol/L (ref 1.12–1.32)
Creatinine, Ser: 0.9 mg/dL (ref 0.50–1.10)
TCO2: 26 mmol/L (ref 0–100)

## 2011-07-22 LAB — COMPREHENSIVE METABOLIC PANEL
Alkaline Phosphatase: 57
BUN: 6
GFR calc non Af Amer: >60
Glucose, Bld: 87
Potassium: 3.6
Total Protein: 6.8

## 2011-07-22 LAB — URINALYSIS, ROUTINE W REFLEX MICROSCOPIC
Glucose, UA: NEGATIVE
Glucose, UA: NEGATIVE
Hgb urine dipstick: NEGATIVE
Ketones, ur: NEGATIVE
Nitrite: NEGATIVE
Protein, ur: NEGATIVE
Protein, ur: NEGATIVE
Urobilinogen, UA: 0.2

## 2011-07-22 LAB — CBC
HCT: 36.4
Hemoglobin: 12.6
Hemoglobin: 12.6
MCHC: 33.9
MCHC: 34.8
RBC: 3.85 — ABNORMAL LOW
RDW: 13.4
WBC: 11.1 — ABNORMAL HIGH

## 2011-07-22 LAB — GC/CHLAMYDIA PROBE AMP, GENITAL: Chlamydia, DNA Probe: NEGATIVE

## 2011-07-22 LAB — SAMPLE TO BLOOD BANK

## 2011-07-22 LAB — WET PREP, GENITAL
Clue Cells Wet Prep HPF POC: NONE SEEN
Trich, Wet Prep: NONE SEEN
Yeast Wet Prep HPF POC: NONE SEEN

## 2011-07-22 LAB — POCT PREGNANCY, URINE: Preg Test, Ur: POSITIVE

## 2011-07-23 LAB — POCT HEMOGLOBIN-HEMACUE: Operator id: 112821

## 2011-07-24 LAB — POCT PREGNANCY, URINE: Operator id: 149021

## 2012-02-17 ENCOUNTER — Emergency Department (HOSPITAL_COMMUNITY)
Admission: EM | Admit: 2012-02-17 | Discharge: 2012-02-17 | Disposition: A | Discharge status: Home / Self Care | Payer: Self-pay | Attending: Emergency Medicine | Admitting: Emergency Medicine

## 2012-02-17 ENCOUNTER — Encounter (HOSPITAL_COMMUNITY): Payer: Self-pay | Admitting: Emergency Medicine

## 2012-02-17 DIAGNOSIS — M545 Low back pain, unspecified: Secondary | ICD-10-CM | POA: Insufficient documentation

## 2012-02-17 DIAGNOSIS — N949 Unspecified condition associated with female genital organs and menstrual cycle: Secondary | ICD-10-CM | POA: Insufficient documentation

## 2012-02-17 DIAGNOSIS — I10 Essential (primary) hypertension: Secondary | ICD-10-CM | POA: Insufficient documentation

## 2012-02-17 DIAGNOSIS — Z711 Person with feared health complaint in whom no diagnosis is made: Secondary | ICD-10-CM

## 2012-02-17 HISTORY — DX: Essential (primary) hypertension: I10

## 2012-02-17 LAB — URINALYSIS, ROUTINE W REFLEX MICROSCOPIC
Bilirubin Urine: NEGATIVE
Glucose, UA: NEGATIVE mg/dL
Hgb urine dipstick: NEGATIVE
Ketones, ur: NEGATIVE mg/dL
pH: 7 (ref 5.0–8.0)

## 2012-02-17 LAB — WET PREP, GENITAL
Clue Cells Wet Prep HPF POC: NONE SEEN
Trich, Wet Prep: NONE SEEN
Yeast Wet Prep HPF POC: NONE SEEN

## 2012-02-17 MED ORDER — CEFTRIAXONE SODIUM 250 MG IJ SOLR
250.0000 mg | Freq: Once | INTRAMUSCULAR | Status: AC
  Administered 2012-02-17: 250 mg via INTRAMUSCULAR
  Filled 2012-02-17: qty 250

## 2012-02-17 MED ORDER — LIDOCAINE HCL 1 % IJ SOLN
INTRAMUSCULAR | Status: AC
  Filled 2012-02-17: qty 20

## 2012-02-17 MED ORDER — AZITHROMYCIN 250 MG PO TABS
1000.0000 mg | ORAL_TABLET | Freq: Once | ORAL | Status: AC
Start: 2012-02-17 — End: 2012-02-17
  Administered 2012-02-17: 1000 mg via ORAL
  Filled 2012-02-17: qty 4

## 2012-02-17 NOTE — ED Notes (Signed)
States partner called her and said that he had penile drainage, had unprotected sex Monday. Denies any drainage but has low back pain

## 2012-02-17 NOTE — ED Notes (Signed)
Pelvic exam done.

## 2012-02-17 NOTE — ED Provider Notes (Signed)
Medical screening examination/treatment/procedure(s) were performed by non-physician practitioner and as supervising physician I was immediately available for consultation/collaboration.  Cheri Guppy, MD 02/17/12 2352

## 2012-02-17 NOTE — ED Provider Notes (Signed)
History     CSN: [id number]  Arrival date & time 02/17/12  1602   First MD Initiated Contact with Patient 02/17/12 1714      Chief Complaint  Patient presents with  . Pelvic Pain    (Consider location/radiation/quality/duration/timing/severity/associated sxs/prior treatment) HPI  Patient presents to the ED with complaints of low back pain and wanting to get checked for STD. She says that her childrens dad who she has sexual relations with informed her that he has been having penile discharge. Therefore she figured she should come get checked. She denies having any discharge herself.  Past Medical History  Diagnosis Date  . Hypertension     Past Surgical History  Procedure Date  . Tubal ligation     History reviewed. No pertinent family history.  History  Substance Use Topics  . Smoking status: Current Some Day Smoker -- 0.5 packs/day  . Smokeless tobacco: Not on file  . Alcohol Use: Yes     socially    OB History    Grav Para Term Preterm Abortions TAB SAB Ect Mult Living                  Review of Systems   HEENT: denies blurry vision or change in hearing PULMONARY: Denies difficulty breathing and SOB CARDIAC: denies chest pain or heart palpitations MUSCULOSKELETAL:  denies being unable to ambulate ABDOMEN AL: denies abdominal pain GU: denies loss of bowel or urinary control NEURO: denies numbness and tingling in extremities   Allergies  Review of patient's allergies indicates no known allergies.  Home Medications  No current outpatient prescriptions on file.  BP 167/108  Pulse 88  Temp(Src) 98.2 F (36.8 C) (Oral)  Resp 18  SpO2 99%  LMP 01/25/2012  Physical Exam  Nursing note and vitals reviewed. Constitutional: She appears well-developed and well-nourished. No distress.  HENT:  Head: Normocephalic and atraumatic.  Eyes: Pupils are equal, round, and reactive to light.  Neck: Normal range of motion. Neck supple.  Cardiovascular: Normal  rate and regular rhythm.   Pulmonary/Chest: Effort normal.  Abdominal: Soft.  Genitourinary: Uterus normal. Cervix exhibits no motion tenderness, no discharge and no friability. No erythema, tenderness or bleeding around the vagina. No foreign body around the vagina. No signs of injury around the vagina. No vaginal discharge found.  Neurological: She is alert.  Skin: Skin is warm and dry.    ED Course  Procedures (including critical care time)   Labs Reviewed  URINALYSIS, ROUTINE W REFLEX MICROSCOPIC  POCT PREGNANCY, URINE  WET PREP, GENITAL  GC/CHLAMYDIA PROBE AMP, GENITAL   No results found.   1. Concern about STD in female without diagnosis       MDM  Pt treated for [facility] in ED. Wet prep and UA negative, cultures for [facility] sent out.  Pt to follow-up with womens clinic for further concerns.  Pt has been advised of the symptoms that warrant their return to the ED. Patient has voiced understanding and has agreed to follow-up with the PCP or specialist.         Dorthula Matas, PA 02/17/12 [phone]

## 2012-02-18 LAB — GC/CHLAMYDIA PROBE AMP, GENITAL
Chlamydia, DNA Probe: NEGATIVE
GC Probe Amp, Genital: NEGATIVE

## 2012-03-16 ENCOUNTER — Emergency Department (HOSPITAL_COMMUNITY)
Admission: EM | Admit: 2012-03-16 | Discharge: 2012-03-16 | Disposition: A | Discharge status: Home / Self Care | Payer: Self-pay | Attending: Emergency Medicine | Admitting: Emergency Medicine

## 2012-03-16 ENCOUNTER — Encounter (HOSPITAL_COMMUNITY): Payer: Self-pay | Admitting: *Deleted

## 2012-03-16 ENCOUNTER — Emergency Department (HOSPITAL_COMMUNITY): Payer: Self-pay

## 2012-03-16 DIAGNOSIS — I10 Essential (primary) hypertension: Secondary | ICD-10-CM | POA: Insufficient documentation

## 2012-03-16 DIAGNOSIS — X58XXXA Exposure to other specified factors, initial encounter: Secondary | ICD-10-CM | POA: Insufficient documentation

## 2012-03-16 DIAGNOSIS — Y998 Other external cause status: Secondary | ICD-10-CM | POA: Insufficient documentation

## 2012-03-16 DIAGNOSIS — F17200 Nicotine dependence, unspecified, uncomplicated: Secondary | ICD-10-CM | POA: Insufficient documentation

## 2012-03-16 DIAGNOSIS — S9030XA Contusion of unspecified foot, initial encounter: Secondary | ICD-10-CM | POA: Insufficient documentation

## 2012-03-16 DIAGNOSIS — Y9361 Activity, american tackle football: Secondary | ICD-10-CM | POA: Insufficient documentation

## 2012-03-16 DIAGNOSIS — S9031XA Contusion of right foot, initial encounter: Secondary | ICD-10-CM

## 2012-03-16 MED ORDER — IBUPROFEN 800 MG PO TABS: 800.0000 mg | ORAL_TABLET | Freq: Three times a day (TID) | ORAL | Status: AC | PRN

## 2012-03-16 NOTE — ED Provider Notes (Signed)
History     CSN: [id number]  Arrival date & time 03/16/12  1339   First MD Initiated Contact with Patient 03/16/12 1725      Chief Complaint  Patient presents with  . Ankle Pain  . Toe Pain    (Consider location/radiation/quality/duration/timing/severity/associated sxs/prior treatment) HPI Comments: Patient reports right foot pain that began after playing football yesterday.  States she was playing barefoot.  States the pain is aching and sore and throbbing, worse with palpation and walking, approximately 5/10 intensity.  Denies any known injury but she notes she has many bruises and cuts and scrapes on her legs from the game.  Also notes mild swelling of left ankle that is not bothering her much and does not hurt.  Denies weakness or numbness of the toes.  Denies any itching.  Denies allergies, insect bites, or any other exposures while on the field.    Patient is a 36 y.o. female presenting with ankle pain and toe pain. The history is provided by the patient.  Ankle Pain  Pertinent negatives include no numbness.  Toe Pain Pertinent negatives include no chills, fever, numbness, rash or weakness.    Past Medical History  Diagnosis Date  . Hypertension     Past Surgical History  Procedure Date  . Tubal ligation     No family history on file.  History  Substance Use Topics  . Smoking status: Current Some Day Smoker -- 0.5 packs/day  . Smokeless tobacco: Not on file  . Alcohol Use: Yes     socially    OB History    Grav Para Term Preterm Abortions TAB SAB Ect Mult Living                  Review of Systems  Constitutional: Negative for fever and chills.  Musculoskeletal: Negative for gait problem.  Skin: Positive for color change and wound. Negative for rash.  Neurological: Negative for weakness and numbness.    Allergies  Review of patient's allergies indicates no known allergies.  Home Medications  No current outpatient prescriptions on file.  BP 158/89   Pulse 86  Temp(Src) 98.8 F (37.1 C) (Oral)  Resp 19  SpO2 100%  LMP 02/24/2012  Physical Exam  Constitutional: She appears well-developed and well-nourished.  HENT:  Head: Normocephalic and atraumatic.  Neck: Neck supple.  Pulmonary/Chest: Effort normal.  Musculoskeletal:       Right ankle: Normal.       Left ankle: Normal.       Feet:       Right foot: Pt moves all toes.  Pulses intact.  Sensation intact.    Neurological: She is alert.    ED Course  Procedures (including critical care time)  Labs Reviewed - No data to display Dg Foot Complete Right  03/16/2012  *RADIOLOGY REPORT*  Clinical Data: Pain post injury  RIGHT FOOT COMPLETE - 3+ VIEW  Comparison: None.  Findings: Three views of the right foot submitted.  No acute fracture or subluxation.  No radiopaque foreign body.  IMPRESSION: No acute fracture or subluxation.  Original Report Authenticated By: Natasha Mead, M.D.     1. Contusion of right foot       MDM  Patient with erythema and tenderness to right distal 4th and 5th metatarsals after playing football yesterday without shoes.  Likely contusion.  Xray is negative.  Area is not warm or edematous - doubt skin infection.  No pruritis - doubt insect bite.  No  lesion visible.  Pt d/c home with 800mg  ibuprofen, RICE instructions.  Resources given for PCP follow up.  Patient verbalizes understanding and agrees with plan.         Dillard Cannon [place], [place] 03/16/12 2204

## 2012-03-16 NOTE — ED Notes (Signed)
Pt here from home. C/o right pinky toe pain and left ankle pain. States she is unsure of how injury occurred - was" playing last night at home", pain began in middle of night. Left ankle appears swollen

## 2012-03-16 NOTE — ED Provider Notes (Signed)
Medical screening examination/treatment/procedure(s) were performed by non-physician practitioner and as supervising physician I was immediately available for consultation/collaboration.  Flint Melter, MD 03/16/12 [phone]

## 2012-03-16 NOTE — ED Notes (Signed)
Ace wrap applied to rt foot, good movement of toes.

## 2012-03-16 NOTE — Discharge Instructions (Signed)
Read the information below.  Please follow the RICE instructions listed below while you heal.  See the resources below to find a primary care provider for follow up.  You may return to the ER at any time for worsening condition or any new symptoms that concern you.  Contusion A contusion is a deep bruise. Contusions happen when an injury causes bleeding under the skin. Signs of bruising include pain, puffiness (swelling), and discolored skin. The contusion may turn blue, purple, or yellow. HOME CARE   Put ice on the injured area.   Put ice in a plastic bag.   Place a towel between your skin and the bag.   Leave the ice on for 15 to 20 minutes, 3 to 4 times a day.   Only take medicine as told by your doctor.   Rest the injured area.   If possible, raise (elevate) the injured area to lessen puffiness.  GET HELP RIGHT AWAY IF:   You have more bruising or puffiness.   You have pain that is getting worse.   Your puffiness or pain is not helped by medicine.  MAKE SURE YOU:   Understand these instructions.   Will watch your condition.   Will get help right away if you are not doing well or get worse.  Document Released: 03/16/2008 Document Revised: 09/17/2011 Document Reviewed: 08/03/2011 [facility] Patient Information 2012 [place]. , [place].   If you have no primary doctor, here are some resources that may be helpful:  Medicaid-accepting [facility] Providers:   - [facility]- [address]      [phone]      Mon-Fri 9am-7pm, Sat 9am-1pm   - [facility]- [address] [place], [place]      [phone]   - [facility]- [address] [place]      [phone]   [facility]- [address]      [phone]   - Renaye Rakers- [address] [place], [address]      [phone]      Only accepts [place] patients       after they have her name applied to their card   Self Pay  (no insurance) in [place]:   - Sickle Cell Patients: Dr Willey Blade, [facility]      [address] [place]      [phone]   - Health [phone]   - Physician Referral Service- [phone]   - [facility]- [address] [place]      [phone]   [facility] [place]- [address]   - [facility]- see information above      (Speak to [employer] if you do not have insurance)   - [facility]- [address] [place]      [phone]   - [facility] [place]- [address]      [phone]   - [facility]- [address]      [phone]   - Dr Julio Sicks-  [address], [place]      [phone]   - [facility]- [address]      [phone]   - [facility]- [address]      [phone]      Also [phone]  [address]      [phone]   - [facility]- [address]      [phone]      1st and 3rd Saturday every month, 10am-1pm Other agencies that provide inexpensive medical care:    [facility]  [phone]    [facility]  [phone]    [facility]  [phone]    [facility]  [phone]    [facility]  [phone]  General Information: Finding a doctor when you do not have health insurance can be tricky. Although you are not limited by an insurance plan, you are of course limited by her finances and how much but he can pay out of pocket.  What are your options if you don't have health insurance?   1) Find a Librarian, academic and Pay Out of Pocket Although you won't have to find out who is covered by your insurance plan, it is a good idea to ask around and get recommendations. You will then need to call the office and see if the doctor you have chosen will accept you as a new patient and what types of options they offer for patients who are self-pay. Some doctors offer discounts or will set up payment plans for  their patients who do not have insurance, but you will need to ask so you aren't surprised when you get to your appointment.  2) Contact Your Local Health Department Not all health departments have doctors that can see patients for sick visits, but many do, so it is worth a call to see if yours does. If you don't know where your local health department is, you can check in your phone book. The CDC also has a tool to help you locate your state's health department, and many state websites also have listings of all of their local health departments.  3) Find a Walk-in Clinic If your illness is not likely to be very severe or complicated, you may want to try a walk in clinic. These are popping up all over the country in pharmacies, drugstores, and shopping centers. They're usually staffed by nurse practitioners or physician assistants that have been trained to treat common illnesses and complaints. They're usually fairly quick and inexpensive. However, if you have serious medical issues or chronic medical problems, these are probably not your best option

## 2013-06-16 ENCOUNTER — Encounter (HOSPITAL_COMMUNITY): Payer: Self-pay | Admitting: Emergency Medicine

## 2013-06-16 ENCOUNTER — Emergency Department (INDEPENDENT_AMBULATORY_CARE_PROVIDER_SITE_OTHER)
Admission: EM | Admit: 2013-06-16 | Discharge: 2013-06-16 | Disposition: A | Discharge status: Home / Self Care | Payer: Self-pay | Source: Home / Self Care

## 2013-06-16 DIAGNOSIS — I10 Essential (primary) hypertension: Secondary | ICD-10-CM

## 2013-06-16 MED ORDER — HYDROCHLOROTHIAZIDE 25 MG PO TABS: 25.0000 mg | ORAL_TABLET | Freq: Every day | ORAL | Status: DC

## 2013-06-16 NOTE — ED Provider Notes (Signed)
CSN: [id number]     Arrival date & time 06/16/13  1008 History   First MD Initiated Contact with Patient 06/16/13 1056     Chief Complaint  Patient presents with  . Hypertension    need medication. states been off meds for over 2 years.    (Consider location/radiation/quality/duration/timing/severity/associated sxs/prior Treatment) HPI Comments: This pleasant 37 year old female was having a physical exam by her employer this morning and found to have hypertension. She reports the blood pressure taken at that time was 188/120. She was told to go to the urgent care for evaluation. She is denying chest pain, shortness of breath, neurologic symptoms or other symptomatology related to hypertension. The only other complaint is that she has 2-3 headaches a week. She is feeling generally well today.   Past Medical History  Diagnosis Date  . Hypertension    Past Surgical History  Procedure Laterality Date  . Tubal ligation     History reviewed. No pertinent family history. History  Substance Use Topics  . Smoking status: Current Some Day Smoker -- 0.50 packs/day  . Smokeless tobacco: Not on file  . Alcohol Use: Yes     Comment: socially   OB History   Grav Para Term Preterm Abortions TAB SAB Ect Mult Living                 Review of Systems  Constitutional: Negative.   HENT: Negative.   Respiratory: Negative.   Cardiovascular: Negative.   Musculoskeletal: Negative.   Neurological: Negative.   Psychiatric/Behavioral: Negative.     Allergies  Review of patient's allergies indicates no known allergies.  Home Medications   Current Outpatient Rx  Name  Route  Sig  Dispense  Refill  . hydrochlorothiazide (HYDRODIURIL) 25 MG tablet   Oral   Take 1 tablet (25 mg total) by mouth daily.   30 tablet   0    BP 180/122  Pulse 77  Temp(Src) 98 F (36.7 C) (Oral)  LMP 05/26/2013 Physical Exam  Nursing note and vitals reviewed. Constitutional: She is oriented to person, place,  and time. She appears well-developed and well-nourished.  HENT:  Mouth/Throat: Oropharynx is clear and moist. No oropharyngeal exudate.  Eyes: Conjunctivae and EOM are normal.  Neck: Normal range of motion. Neck supple.  Cardiovascular: Normal rate, regular rhythm, normal heart sounds and intact distal pulses.   Pulmonary/Chest: Effort normal and breath sounds normal. No respiratory distress. She has no wheezes.  Abdominal: Soft. There is no tenderness.  Musculoskeletal: She exhibits no edema and no tenderness.  Lymphadenopathy:    She has no cervical adenopathy.  Neurological: She is alert and oriented to person, place, and time.  Skin: Skin is warm and dry.    ED Course  Procedures (including critical care time) Labs Review Labs Reviewed  POCT PREGNANCY, URINE   Imaging Review No results found.  MDM   1. HTN (hypertension)      HCTZ 25 mg daily #30 Must followup with PCP as soon as possible Recheck probably for any new symptoms problems or worsening.  Hayden Rasmussen, NP 06/16/13 1154

## 2013-06-16 NOTE — ED Notes (Signed)
Pt was at interview and blood pressure was taken and the reading was 162/142. Pt states that she had been off BP meds for over 2 years do to financial reasons.  Denies headaches, blurred or spotty vision.

## 2013-06-17 NOTE — ED Provider Notes (Signed)
Medical screening examination/treatment/procedure(s) were performed by resident physician or non-physician practitioner and as supervising physician I was immediately available for consultation/collaboration.   Barkley Bruns MD.   Linna Hoff, MD 06/17/13 [phone]

## 2014-02-13 ENCOUNTER — Emergency Department (HOSPITAL_COMMUNITY): Payer: Self-pay

## 2014-02-13 ENCOUNTER — Encounter (HOSPITAL_COMMUNITY): Payer: Self-pay | Admitting: Emergency Medicine

## 2014-02-13 ENCOUNTER — Emergency Department (HOSPITAL_COMMUNITY)
Admission: EM | Admit: 2014-02-13 | Discharge: 2014-02-13 | Disposition: A | Discharge status: Home / Self Care | Payer: Self-pay | Source: Home / Self Care | Attending: Family Medicine | Admitting: Family Medicine

## 2014-02-13 DIAGNOSIS — H6120 Impacted cerumen, unspecified ear: Secondary | ICD-10-CM

## 2014-02-13 MED ORDER — IPRATROPIUM BROMIDE 0.02 % IN SOLN: 0.5000 mg | Freq: Once | RESPIRATORY_TRACT | Status: DC

## 2014-02-13 MED ORDER — TRIAMCINOLONE ACETONIDE 40 MG/ML IJ SUSP: 40.0000 mg | Freq: Once | INTRAMUSCULAR | Status: DC

## 2014-02-13 MED ORDER — ALBUTEROL SULFATE (2.5 MG/3ML) 0.083% IN NEBU: 5.0000 mg | INHALATION_SOLUTION | Freq: Once | RESPIRATORY_TRACT | Status: DC

## 2014-02-13 MED ORDER — METHYLPREDNISOLONE ACETATE 40 MG/ML IJ SUSP: 80.0000 mg | Freq: Once | INTRAMUSCULAR | Status: DC

## 2014-02-13 NOTE — ED Provider Notes (Signed)
CSN: [id number]     Arrival date & time 02/13/14  1134 History   First MD Initiated Contact with Patient 02/13/14 1258     Chief Complaint  Patient presents with  . Otalgia   (Consider location/radiation/quality/duration/timing/severity/associated sxs/prior Treatment) Patient is a 38 y.o. female presenting with ear pain. The history is provided by the patient.  Otalgia Location:  Left Behind ear:  No abnormality Quality:  Dull, throbbing and pressure Severity:  Moderate Onset quality:  Sudden Duration:  3 days Progression:  Unchanged Chronicity:  New Context comment:  Tried to clean with peorxide no relief,  Associated symptoms: no congestion, no fever, no rhinorrhea and no sore throat     Past Medical History  Diagnosis Date  . Hypertension    Past Surgical History  Procedure Laterality Date  . Tubal ligation     No family history on file. History  Substance Use Topics  . Smoking status: Current Some Day Smoker -- 0.50 packs/day  . Smokeless tobacco: Not on file  . Alcohol Use: Yes     Comment: socially   OB History   Grav Para Term Preterm Abortions TAB SAB Ect Mult Living                 Review of Systems  Constitutional: Negative.  Negative for fever.  HENT: Positive for ear pain. Negative for congestion, postnasal drip, rhinorrhea and sore throat.     Allergies  Review of patient's allergies indicates no known allergies.  Home Medications   Prior to Admission medications   Medication Sig Start Date End Date Taking? Authorizing Provider  hydrochlorothiazide (HYDRODIURIL) 25 MG tablet Take 1 tablet (25 mg total) by mouth daily. 06/16/13   Hayden Rasmussenavid Mabe, NP   BP 170/122  Pulse 78  Temp(Src) 98.8 F (37.1 C) (Oral)  Resp 16  SpO2 100% Physical Exam  Nursing note and vitals reviewed. Constitutional: She is oriented to person, place, and time. She appears well-developed and well-nourished.  HENT:  Head: Normocephalic.  Right Ear: External ear normal.    Ears:  Nose: Nose normal.  Mouth/Throat: Oropharynx is clear and moist.  Eyes: Conjunctivae are normal. Pupils are equal, round, and reactive to light.  Neck: Normal range of motion. Neck supple.  Lymphadenopathy:    She has no cervical adenopathy.  Neurological: She is alert and oriented to person, place, and time.  Skin: Skin is warm and dry.    ED Course  Procedures (including critical care time) Labs Review Labs Reviewed - No data to display  Imaging Review No results found.   MDM   1. Cerumen impaction    Sx completely resolved after irrig, tm wnl., canal nl.    Linna HoffJames D Kazuto Sevey, MD 02/13/14 1400

## 2014-02-13 NOTE — ED Notes (Signed)
Left ear and left side of face is painful, throbbing.  Onset 5/2

## 2015-05-13 ENCOUNTER — Emergency Department (HOSPITAL_COMMUNITY)
Admission: EM | Admit: 2015-05-13 | Discharge: 2015-05-13 | Disposition: A | Discharge status: Home / Self Care | Payer: Medicaid Other | Attending: Emergency Medicine | Admitting: Emergency Medicine

## 2015-05-13 ENCOUNTER — Other Ambulatory Visit: Payer: Self-pay

## 2015-05-13 ENCOUNTER — Telehealth: Payer: Self-pay | Admitting: General Practice

## 2015-05-13 ENCOUNTER — Telehealth: Payer: Self-pay

## 2015-05-13 ENCOUNTER — Other Ambulatory Visit: Payer: Self-pay | Admitting: Internal Medicine

## 2015-05-13 ENCOUNTER — Encounter (HOSPITAL_COMMUNITY): Payer: Self-pay | Admitting: Emergency Medicine

## 2015-05-13 ENCOUNTER — Ambulatory Visit
Admission: RE | Admit: 2015-05-13 | Discharge: 2015-05-13 | Disposition: A | Discharge status: Home / Self Care | Payer: Self-pay | Source: Ambulatory Visit | Attending: Internal Medicine | Admitting: Internal Medicine

## 2015-05-13 DIAGNOSIS — N63 Unspecified lump in unspecified breast: Secondary | ICD-10-CM

## 2015-05-13 DIAGNOSIS — N6310 Unspecified lump in the right breast, unspecified quadrant: Secondary | ICD-10-CM

## 2015-05-13 DIAGNOSIS — Z720 Tobacco use: Secondary | ICD-10-CM | POA: Insufficient documentation

## 2015-05-13 DIAGNOSIS — I10 Essential (primary) hypertension: Secondary | ICD-10-CM | POA: Diagnosis not present

## 2015-05-13 DIAGNOSIS — N644 Mastodynia: Secondary | ICD-10-CM | POA: Diagnosis present

## 2015-05-13 MED ORDER — HYDROCHLOROTHIAZIDE 25 MG PO TABS: 25.0000 mg | ORAL_TABLET | Freq: Every day | ORAL | Status: DC

## 2015-05-13 MED ORDER — IBUPROFEN 200 MG PO TABS
600.0000 mg | ORAL_TABLET | Freq: Once | ORAL | Status: AC
  Administered 2015-05-13: 600 mg via ORAL
  Filled 2015-05-13: qty 3

## 2015-05-13 NOTE — ED Provider Notes (Signed)
Pt with painful right breast since yesterday. On exam alert nno toxic, Right breast tender in rigth lower quad. No fluctuance redness or warmth . No axillary nodes ,.nipple is normal . Left breast normal. paln f/u at breast center  Doug Sou, MD 05/13/15 1754

## 2015-05-13 NOTE — ED Provider Notes (Signed)
CSN: [id number]     Arrival date & time 05/13/15  0904 History   First MD Initiated Contact with Patient 05/13/15 1007     Chief Complaint  Patient presents with  . Breast Pain    right    HPI   39 year old female presents today with a master right breast. Patient reports she noticed the mass yesterday, painful, located in the right lower quadrant at the 7:00 position, approximately the size of a golf ball. She reports that she does breast exams, was uncertain as to last time she had one done, uncertain how long this mass is been there. She reports that she does smoke, not using any estrogen, family history of breast Cancer (Grandma currently ). Patient denies fever, chills, signs of infection, nausea, vomiting, or mass in any other portion of her breasts. Patient does not have a primary care provider, has not seen anybody for routine care recently. Patient reports she is not breast-feeding her currently pregnant, no nipple discharge. She denies trauma to the breast.    Past Medical History  Diagnosis Date  . Hypertension    Past Surgical History  Procedure Laterality Date  . Tubal ligation     No family history on file. History  Substance Use Topics  . Smoking status: Current Some Day Smoker -- 0.50 packs/day  . Smokeless tobacco: Not on file  . Alcohol Use: Yes     Comment: socially   OB History    No data available     Review of Systems  All other systems reviewed and are negative.   Allergies  Review of patient's allergies indicates no known allergies.  Home Medications   Prior to Admission medications   Medication Sig Start Date End Date Taking? Authorizing Provider  hydrochlorothiazide (HYDRODIURIL) 25 MG tablet Take 1 tablet (25 mg total) by mouth daily. 05/13/15   Kylee Nardozzi, PA-C   BP 166/107 mmHg  Pulse 66  Temp(Src) 98.2 F (36.8 C) (Oral)  Resp 20  Ht  (1.702 m)  Wt 150 lb (68.04 kg)  BMI 23.49 kg/m2  SpO2 100%   Physical Exam    Constitutional: She is oriented to person, place, and time. She appears well-developed and well-nourished.  HENT:  Head: Normocephalic and atraumatic.  Eyes: Conjunctivae are normal. Pupils are equal, round, and reactive to light. Right eye exhibits no discharge. Left eye exhibits no discharge. No scleral icterus.  Neck: Normal range of motion. No JVD present. No tracheal deviation present.  Pulmonary/Chest: Effort normal. No stridor.    Firm nodule approximately 3 cm in diameter at the 7:00 position of the right breast, no redness, warmth, swelling. Remainder of breast exam bilaterally showed no other masses. No discharge from the nipple.  Neurological: She is alert and oriented to person, place, and time. Coordination normal.  Psychiatric: She has a normal mood and affect. Her behavior is normal. Judgment and thought content normal.  Nursing note and vitals reviewed.   ED Course  Procedures (including critical care time) Labs Review Labs Reviewed - No data to display  Imaging Review No results found.   EKG Interpretation None      MDM   Final diagnoses:  Breast mass    Labs:  Imaging:  Consults: Dr. [facility]  Therapeutics:  Discharge Meds:   Assessment/Plan: Patient presents with a master right breast, uncertain etiology. I spoke with radiologist Linde Gillis MD at [facility], case management here, and arranged patient to be seen  there today for mammogram and ultrasound. Patient assured me that she would follow-up immediately to the breast center for further evaluation.          Eyvonne Mechanic, PA-C 05/13/15 1254  Doug Sou, MD 05/13/15 1754

## 2015-05-13 NOTE — Discharge Instructions (Signed)
Please go straight to the breast center for further evaluation and management. Please follow-up with your scheduled [place] and wellness visit. Please use your blood pressure medication as needed.

## 2015-05-13 NOTE — Telephone Encounter (Signed)
Needs verbal or written order for US BREAST LTD UNI LEFT INC AXILLA (Order #[id number]) on 05/13/15.

## 2015-05-13 NOTE — Discharge Planning (Signed)
[name] J. Clydene Laming, RN, BSN, [place] [phone] ED CM consulted to meet with patient regarding PCP establishment and patient does not have insurance. Pt presented to [facility] ED today with breast pain. Met with patient at bedside, confirmed informaton. Pt reports not having access to f/u care with PCP or insurance coverage. Discussed with patient importance and benefits of establishing PCP, and not utilizing the ED for primary care needs. Pt verbalized understanding and is in agreement. Of options provided, pt voiced interest in the [facility] and [facility].  [facility] Brochure given with address, phone number, and the services highlighted.  NCM contacted Eden Lathe, RN to obtain appointment for PCP establishment.  Opal Sidles provided an appointment of 05/17/15 @ 3:00.  Pt made aware of appointment date and time.

## 2015-05-13 NOTE — ED Notes (Signed)
PA Hedge at bedside with patient and female chapperone during right breast exam.

## 2015-05-13 NOTE — ED Notes (Signed)
Patient coming from home with c/o of right breast pain and palpable knot noticed by patient.  Pain 7/10.

## 2015-05-13 NOTE — ED Notes (Signed)
Patient has a palpable knot to the outer aspect of the right breast.  Patient is pained to the touch.  Area is not red, no streaking.  Patient states her grandmother had breast cancer.

## 2015-05-13 NOTE — Telephone Encounter (Signed)
Call received today from [name], CM when the patient was in the ED,requesting an order for bilateral mammogram and ultrasound at the breast center. The patient has an appointment scheduled at [facility] on 05/17/15 @ 1500.    Dr Hyman Hopes was informed of the request and he approved the order.

## 2015-05-13 NOTE — Telephone Encounter (Signed)
Call received from [name], CM requesting a hospital follow up appointment.  The patient has no insurance and no PCP.   An appointment was scheduled for 05/17/15 @ 1500 w/ Karie Schwalbe, [place].

## 2015-05-15 ENCOUNTER — Telehealth: Payer: Self-pay | Admitting: *Deleted

## 2015-05-15 ENCOUNTER — Emergency Department (HOSPITAL_COMMUNITY)
Admission: EM | Admit: 2015-05-15 | Discharge: 2015-05-15 | Disposition: A | Discharge status: Home / Self Care | Payer: Medicaid Other | Attending: Emergency Medicine | Admitting: Emergency Medicine

## 2015-05-15 ENCOUNTER — Encounter (HOSPITAL_COMMUNITY): Payer: Self-pay | Admitting: Emergency Medicine

## 2015-05-15 DIAGNOSIS — N63 Unspecified lump in unspecified breast: Secondary | ICD-10-CM

## 2015-05-15 DIAGNOSIS — Z720 Tobacco use: Secondary | ICD-10-CM | POA: Diagnosis not present

## 2015-05-15 DIAGNOSIS — I10 Essential (primary) hypertension: Secondary | ICD-10-CM | POA: Insufficient documentation

## 2015-05-15 DIAGNOSIS — Z79899 Other long term (current) drug therapy: Secondary | ICD-10-CM | POA: Insufficient documentation

## 2015-05-15 DIAGNOSIS — N644 Mastodynia: Secondary | ICD-10-CM | POA: Diagnosis present

## 2015-05-15 MED ORDER — KETOROLAC TROMETHAMINE 60 MG/2ML IM SOLN
60.0000 mg | Freq: Once | INTRAMUSCULAR | Status: AC
  Administered 2015-05-15: 60 mg via INTRAMUSCULAR
  Filled 2015-05-15: qty 2

## 2015-05-15 MED ORDER — HYDROCODONE-ACETAMINOPHEN 5-325 MG PO TABS: 1.0000 | ORAL_TABLET | ORAL | Status: DC | PRN

## 2015-05-15 NOTE — ED Provider Notes (Addendum)
CSN: [id number]     Arrival date & time 05/15/15  0500 History   First MD Initiated Contact with Patient 05/15/15 0515     Chief Complaint  Patient presents with  . Breast Pain     (Consider location/radiation/quality/duration/timing/severity/associated sxs/prior Treatment) HPI Comments: Patient is 39 year old female with a past medical history of hypertension who was recently seen in the emergency department on 05/13/2015 with a tender right breast mass. She was sent directly over to the breast Center and received a mammogram and ultrasound at that time which showed a suspicious right breast mass concerning for malignancy and surrounding inflammation versus a developing abscess. An ultrasound-guided biopsy was recommended. She is currently waiting for them to call her back about 1 to get the biopsy. However overnight the pain has significantly worsened. She took Advil PM earlier in the day with some improvement but now the pain is worse and she is unable to sleep. She denies any redness, drainage to the area. No trauma  The history is provided by the patient.    Past Medical History  Diagnosis Date  . Hypertension    Past Surgical History  Procedure Laterality Date  . Tubal ligation     No family history on file. History  Substance Use Topics  . Smoking status: Current Some Day Smoker -- 0.50 packs/day  . Smokeless tobacco: Not on file  . Alcohol Use: Yes     Comment: socially   OB History    No data available     Review of Systems  All other systems reviewed and are negative.     Allergies  Review of patient's allergies indicates no known allergies.  Home Medications   Prior to Admission medications   Medication Sig Start Date End Date Taking? Authorizing Provider  hydrochlorothiazide (HYDRODIURIL) 25 MG tablet Take 1 tablet (25 mg total) by mouth daily. 05/13/15  Yes Jeffrey Hedges, PA-C  Ibuprofen-Diphenhydramine Cit (ADVIL PM) 200-38 MG TABS Take 1 tablet by mouth  at bedtime as needed (for sleep).   Yes Historical Provider, MD   BP 157/101 mmHg  Pulse 90  Temp(Src) 98.6 F (37 C) (Oral)  Resp 20  Ht  (1.702 m)  Wt 150 lb (68.04 kg)  BMI 23.49 kg/m2  SpO2 98%  LMP 04/15/2015 (Approximate) Physical Exam  Constitutional: She is oriented to person, place, and time. She appears well-developed and well-nourished.  Tearful on exam  HENT:  Head: Normocephalic and atraumatic.  Cardiovascular: Normal rate.   Pulmonary/Chest: Effort normal.    Neurological: She is alert and oriented to person, place, and time.  Skin: Skin is warm and dry.  Psychiatric: She has a normal mood and affect. Her behavior is normal.  Nursing note and vitals reviewed.   ED Course  Procedures (including critical care time) Labs Review Labs Reviewed - No data to display  Imaging Review US Breast Ltd Uni Left Inc Axilla  05/13/2015   CLINICAL DATA:  Right breast lower outer quadrant palpable and painful mass, detected by the patient 2 days ago.  EXAM: DIGITAL DIAGNOSTIC BILATERAL MAMMOGRAM WITH 3D TOMOSYNTHESIS WITH CAD  ULTRASOUND BILATERAL BREAST  COMPARISON:  None available.  ACR Breast Density Category c: The breast tissue is heterogeneously dense, which may obscure small masses.  FINDINGS: There is an ill-defined hyperdense mass in the right breast slightly lower outer quadrant, posterior depth, which corresponds to the area of palpable concern, as indicated by a BB marker. No other suspicious masses are seen in  the right breast.  In the left breast, there is a circumscribed sub cm nodule in the upper inner breast, anterior depth.  Mammographic images were processed with CAD.  On physical exam, there is a palpable ill marginated mass in the right breast lower outer quadrant which on palpation measures 2-3 cm. The patient describes tenderness on palpation.  Targeted right breast ultrasound is performed, showing 8 o'clock 4 cm from the nipple hypoechoic mass with somewhat  obscured borders measuring 1.6 by 1.7 by 1.2 cm. Surrounding breast parenchymal edema is noted. No internal vascularity is seen. Ultrasound examination of the right axilla demonstrates no evidence of lymphadenopathy.  Targeted left breast ultrasound demonstrates 11 o'clock 1 cm from the nipple benign-appearing hypoechoic circumscribed horizontally oriented nodule measuring 0.7 by 0.7 by 0.3 cm.  IMPRESSION: Right breast 8 o'clock suspicious in appearance palpable mass. Differential diagnosis includes breast malignancy with surrounding inflammatory reaction versus developing breast abscess.  Benign-appearing left breast 11 o'clock cyst.  RECOMMENDATION: Ultrasound-guided core needle biopsy of the right breast.  The patient was given a referral to the Breast Cancer Control Program, and her biopsy will be scheduled as soon as possible at the Breast Center.  I have discussed the findings and recommendations with the patient. Results were also provided in writing at the conclusion of the visit. If applicable, a reminder letter will be sent to the patient regarding the next appointment.  BI-RADS CATEGORY  4: Suspicious.   Electronically Signed   By: Ted Mcalpine M.D.   On: 05/13/2015 17:53   US Breast Ltd Uni Right Inc Axilla  05/13/2015   CLINICAL DATA:  Right breast lower outer quadrant palpable and painful mass, detected by the patient 2 days ago.  EXAM: DIGITAL DIAGNOSTIC BILATERAL MAMMOGRAM WITH 3D TOMOSYNTHESIS WITH CAD  ULTRASOUND BILATERAL BREAST  COMPARISON:  None available.  ACR Breast Density Category c: The breast tissue is heterogeneously dense, which may obscure small masses.  FINDINGS: There is an ill-defined hyperdense mass in the right breast slightly lower outer quadrant, posterior depth, which corresponds to the area of palpable concern, as indicated by a BB marker. No other suspicious masses are seen in the right breast.  In the left breast, there is a circumscribed sub cm nodule in the upper  inner breast, anterior depth.  Mammographic images were processed with CAD.  On physical exam, there is a palpable ill marginated mass in the right breast lower outer quadrant which on palpation measures 2-3 cm. The patient describes tenderness on palpation.  Targeted right breast ultrasound is performed, showing 8 o'clock 4 cm from the nipple hypoechoic mass with somewhat obscured borders measuring 1.6 by 1.7 by 1.2 cm. Surrounding breast parenchymal edema is noted. No internal vascularity is seen. Ultrasound examination of the right axilla demonstrates no evidence of lymphadenopathy.  Targeted left breast ultrasound demonstrates 11 o'clock 1 cm from the nipple benign-appearing hypoechoic circumscribed horizontally oriented nodule measuring 0.7 by 0.7 by 0.3 cm.  IMPRESSION: Right breast 8 o'clock suspicious in appearance palpable mass. Differential diagnosis includes breast malignancy with surrounding inflammatory reaction versus developing breast abscess.  Benign-appearing left breast 11 o'clock cyst.  RECOMMENDATION: Ultrasound-guided core needle biopsy of the right breast.  The patient was given a referral to the Breast Cancer Control Program, and her biopsy will be scheduled as soon as possible at the Breast Center.  I have discussed the findings and recommendations with the patient. Results were also provided in writing at the conclusion of the visit. If  applicable, a reminder letter will be sent to the patient regarding the next appointment.  BI-RADS CATEGORY  4: Suspicious.   Electronically Signed   By: Ted Mcalpine M.D.   On: 05/13/2015 17:53   Mm Diag Breast Tomo Bilateral  05/13/2015   CLINICAL DATA:  Right breast lower outer quadrant palpable and painful mass, detected by the patient 2 days ago.  EXAM: DIGITAL DIAGNOSTIC BILATERAL MAMMOGRAM WITH 3D TOMOSYNTHESIS WITH CAD  ULTRASOUND BILATERAL BREAST  COMPARISON:  None available.  ACR Breast Density Category c: The breast tissue is  heterogeneously dense, which may obscure small masses.  FINDINGS: There is an ill-defined hyperdense mass in the right breast slightly lower outer quadrant, posterior depth, which corresponds to the area of palpable concern, as indicated by a BB marker. No other suspicious masses are seen in the right breast.  In the left breast, there is a circumscribed sub cm nodule in the upper inner breast, anterior depth.  Mammographic images were processed with CAD.  On physical exam, there is a palpable ill marginated mass in the right breast lower outer quadrant which on palpation measures 2-3 cm. The patient describes tenderness on palpation.  Targeted right breast ultrasound is performed, showing 8 o'clock 4 cm from the nipple hypoechoic mass with somewhat obscured borders measuring 1.6 by 1.7 by 1.2 cm. Surrounding breast parenchymal edema is noted. No internal vascularity is seen. Ultrasound examination of the right axilla demonstrates no evidence of lymphadenopathy.  Targeted left breast ultrasound demonstrates 11 o'clock 1 cm from the nipple benign-appearing hypoechoic circumscribed horizontally oriented nodule measuring 0.7 by 0.7 by 0.3 cm.  IMPRESSION: Right breast 8 o'clock suspicious in appearance palpable mass. Differential diagnosis includes breast malignancy with surrounding inflammatory reaction versus developing breast abscess.  Benign-appearing left breast 11 o'clock cyst.  RECOMMENDATION: Ultrasound-guided core needle biopsy of the right breast.  The patient was given a referral to the Breast Cancer Control Program, and her biopsy will be scheduled as soon as possible at the Breast Center.  I have discussed the findings and recommendations with the patient. Results were also provided in writing at the conclusion of the visit. If applicable, a reminder letter will be sent to the patient regarding the next appointment.  BI-RADS CATEGORY  4: Suspicious.   Electronically Signed   By: Ted Mcalpine M.D.    On: 05/13/2015 17:53     EKG Interpretation None      MDM   Final diagnoses:  Breast mass in female  Breast pain   patient recently seen in the emergency room for a breast mass in the right breast at about 7:00 and sent to the breast Center. At that time a mammogram and ultrasound were done that showed a suspicious right breast mass that could be malignancy versus developing abscess. They further recommended biopsy. Patient is waiting to hear from them about the biopsy but states the pain is worsening tonight despite taking Advil PM.  On exam patient has a palpable tender firm breast mass but no warmth, erythema fluctuance or pointing.  Patient given IM Toradol and a prescription for pain control.    Gwyneth Sprout, MD 05/15/15 8657  Gwyneth Sprout, MD 05/15/15 [phone]

## 2015-05-15 NOTE — Discharge Instructions (Signed)
Breast Biopsy A breast biopsy is a procedure where a sample of breast tissue is removed from your breast. The tissue is examined under a microscope to see if cancerous cells are present. A breast biopsy is done when there is:  Any undiagnosed breast mass (tumor).  Nipple abnormalities, dimpling, crusting, or ulcerations.  Abnormal discharge from the nipple, especially blood.  Redness, swelling, and pain of the breast.  Calcium deposits (calcifications) or abnormalities seen on a mammogram, ultrasound result, or results of magnetic resonance imaging (MRI).  Suspicious changes in the breast seen on your mammogram. If the tumor is found to be cancerous (malignant), a breast biopsy can help to determine what the best treatment is for you. There are many different types of breast biopsies. Talk to your caregiver about your options and which type is best for you. LET YOUR CAREGIVER KNOW ABOUT:  Allergies to food or medicine.  Medicines taken, including vitamins, herbs, eyedrops, over-the-counter medicines, and creams.  Use of steroids (by mouth or creams).  Previous problems with anesthetics or numbing medicines.  History of bleeding problems or blood clots.  Previous surgery.  Other health problems, including diabetes and kidney problems.  Any recent colds or infections.  Possibility of pregnancy, if this applies. RISKS AND COMPLICATIONS   Bleeding.  Infection.  Allergy to medicines.  Bruising and swelling of the breast.  Alteration in the shape of the breast.  Not finding the lump or abnormality.  Needing more surgery. BEFORE THE PROCEDURE  Arrange for someone to drive you home after the procedure.  Do not smoke for 2 weeks before the procedure. Stop smoking, if you smoke.  Do not drink alcohol for 24 hours before procedure.  Wear a good support bra to the procedure. PROCEDURE  You may be given a medicine to numb the breast area (local anesthesia) or a medicine  to make you sleep (general anesthesia) during the procedure. The following are the different types of biopsies that can be performed.   Fine-needle aspiration--A thin needle is attached to a syringe and inserted into the breast lump. Fluid and cells are removed and then looked at under a microscope. If the breast lump cannot be felt, an ultrasound may be used to help locate the lump and place the needle in the correct area.   Core needle biopsy--A wide, hollow needle (core needle) is inserted into the breast lump 3-6 times to get tissue samples or cores. The samples are removed. The needle is usually placed in the correct area by using an ultrasound or X-ray.   Stereotactic biopsy--X-ray equipment and a computer are used to analyze X-ray pictures of the breast lump. The computer then finds exactly where the core needle needs to be inserted. Tissue samples are removed.   Vacuum-assisted biopsy--A small incision (less than  inch) is made in your breast. A biopsy device that includes a hollow needle and vacuum is passed through the incision and into the breast tissue. The vacuum gently draws abnormal breast tissue into the needle to remove it. This type of biopsy removes a larger tissue sample than a regular core needle biopsy. No stitches are needed, and there is usually little scarring.  Ultrasound-guided core needle biopsy--A high frequency ultrasound helps guide the core needle to the area of the mass or abnormality. An incision is made to insert the needle. Tissue samples are removed.  Open biopsy--A larger incision is made in the breast. Your caregiver will attempt to remove the whole breast lump or   as much as possible. AFTER THE PROCEDURE  You will be taken to the recovery area. If you are doing well and have no problems, you will be allowed to go home.  You may notice bruising on your breast. This is normal.  Your caregiver may apply a pressure dressing on your breast for 24-48 hours. A  pressure dressing is a bandage that is wrapped tightly around the chest to stop fluid from collecting underneath tissues. Document Released: 09/28/2005 Document Revised: 01/23/2013 Document Reviewed: 10/29/2011 ExitCare Patient Information 2015 [employer]. This information is not intended to replace advice given to you by your health care provider. Make sure you discuss any questions you have with your health care provider.   

## 2015-05-15 NOTE — ED Notes (Signed)
Pt reports being seen for lump in R breast earlier this week, reports follow up with [facility].  Pt reports pain worse over last 24 hours, reports feeling like lump is larger.  Pt reports unable to sleep d/t pain.

## 2015-05-15 NOTE — ED Notes (Signed)
Pt left with all belongings and ambulated out of the treatment area.  

## 2015-05-15 NOTE — Telephone Encounter (Signed)
Pt called stating that she received call from [facility] but no message was left and each time she tries to return the call; she is placed on call for long periods of time.  NCM reviewed chart and could not find a note as to what call was pertaining to.  Suggested pt continue to call until she reaches someone and that perhaps it was to remind her of her upcoming appointment on Friday.

## 2015-05-17 ENCOUNTER — Other Ambulatory Visit: Payer: Self-pay | Admitting: Physician Assistant

## 2015-05-17 ENCOUNTER — Encounter: Payer: Self-pay | Admitting: Physician Assistant

## 2015-05-17 ENCOUNTER — Ambulatory Visit: Payer: Self-pay | Attending: Physician Assistant | Admitting: Physician Assistant

## 2015-05-17 VITALS — BP 151/99 | HR 92

## 2015-05-17 DIAGNOSIS — N63 Unspecified lump in unspecified breast: Secondary | ICD-10-CM

## 2015-05-17 DIAGNOSIS — R030 Elevated blood-pressure reading, without diagnosis of hypertension: Secondary | ICD-10-CM

## 2015-05-17 DIAGNOSIS — IMO0001 Reserved for inherently not codable concepts without codable children: Secondary | ICD-10-CM

## 2015-05-17 MED ORDER — HYDROCODONE-ACETAMINOPHEN 5-325 MG PO TABS: 1.0000 | ORAL_TABLET | ORAL | Status: DC | PRN

## 2015-05-17 MED ORDER — CLONIDINE HCL 0.1 MG PO TABS
0.1000 mg | ORAL_TABLET | Freq: Once | ORAL | Status: AC
  Administered 2015-05-17: 0.1 mg via ORAL

## 2015-05-17 NOTE — Progress Notes (Signed)
   [name]  ZOX:[id number]  [id number]:[id number]  DOB - 1975/12/23  Chief Complaint  Patient presents with  . Breast Mass       Subjective:   [name] is a 39 y.o. female here today for establishment of care. On 05/13/2015 she was doing a self breast exam and noted a knot on the outside of the right breast. She went to the emergency department. They then set her up to have a bilateral mammogram done as well as ultrasound. There were abnormalities noted suspicious for malignancy versus abscess. It is recommended that she undergo a ultrasound-guided core needle biopsy of the right breast as soon as possible. She was given a prescription for Vicodin which only temporarily numbs her pain. She is still very tender in that area and of course aggravated by bra use.  Incidentally she was noted to have elevations in her blood pressure. She was prescribed HCTZ. She never got this prescription filled. Aches, earaches or nosebleeds. She denies chest pain or palpitations. She denies shortness of breath.   ROS: GEN: denies fever or chills, denies change in weight Breasts: Positive pain and tenderness in the right breast. No drainage. LUNGS: denies SHOB, dyspnea, PND, orthopnea CV: denies CP or palpitations ABD: denies abd pain, N or V   ALLERGIES: No Known Allergies  PAST MEDICAL HISTORY: Past Medical History  Diagnosis Date  . Hypertension     PAST SURGICAL HISTORY: Past Surgical History  Procedure Laterality Date  . Tubal ligation      MEDICATIONS AT HOME: Prior to Admission medications   Medication Sig Start Date End Date Taking? Authorizing Provider  hydrochlorothiazide (HYDRODIURIL) 25 MG tablet Take 1 tablet (25 mg total) by mouth daily. 05/13/15   Eyvonne Mechanic, PA-C  HYDROcodone-acetaminophen (NORCO/VICODIN) 5-325 MG per tablet Take 1-2 tablets by mouth every 4 (four) hours as needed. 05/17/15   Clay Solum Netta Cedars, PA-C  Ibuprofen-Diphenhydramine Cit (ADVIL PM) 200-38 MG TABS  Take 1 tablet by mouth at bedtime as needed (for sleep).    Historical Provider, MD     Objective:   Filed Vitals:   05/17/15 1514 05/17/15 1518  BP: 160/110 164/113  Pulse:  92    Exam General appearance : Awake, alert, not in any distress. Speech Clear. Not toxic looking HEENT: Atraumatic and Normocephalic, pupils equally reactive to light and accomodation Breast: Golf size solid mass at the 8:00 position of the right breast. Lower outer quadrant right breast. No drainage. Chest:Good air entry bilaterally, no added sounds  CVS: S1 S2 regular, no murmurs.  Skin:No Rash   Assessment & Plan  1. Right breast mass  -Ultrasound-guided core needle biopsy of the right breast  -If positive, Oncology referral 2. Hypertension  -Given clonidine today  -Starting HCTZ  3. Smoker  -Cessation discussed 4. Positive family history of breast cancer   Financial counselor Return in about 4 weeks (around 06/14/2015). Needs BMP.  The patient was given clear instructions to go to ER or return to medical center if symptoms don't improve, worsen or new problems develop. The patient verbalized understanding. The patient was told to call to get lab results if they haven't heard anything in the next week.   This note has been created with Education officer, environmental. Any transcriptional errors are unintentional.    Scot Jun, PA-C [facility] [place], [place] [phone]   05/17/2015, 3:21 PM

## 2015-05-17 NOTE — Progress Notes (Signed)
Patient here to establish care Patient was recently seen at [facility] ed and has been diagnosed with Bilateral breast mass Patient will need to have needle biopsy to both breasts

## 2015-05-17 NOTE — Addendum Note (Signed)
Addended by: Lestine Mount on: 05/17/2015 03:49 PM   Modules accepted: Orders

## 2015-05-20 ENCOUNTER — Telehealth (HOSPITAL_COMMUNITY): Payer: Self-pay | Admitting: *Deleted

## 2015-05-20 NOTE — Telephone Encounter (Signed)
Telephoned patient at home # and left message with female at # to return call to [facility]

## 2015-05-21 ENCOUNTER — Other Ambulatory Visit: Payer: Self-pay | Admitting: Physician Assistant

## 2015-05-21 DIAGNOSIS — N63 Unspecified lump in unspecified breast: Secondary | ICD-10-CM

## 2015-05-22 ENCOUNTER — Other Ambulatory Visit: Payer: Self-pay | Admitting: Physician Assistant

## 2015-05-22 ENCOUNTER — Other Ambulatory Visit: Payer: Self-pay

## 2015-05-22 ENCOUNTER — Telehealth: Payer: Self-pay | Admitting: General Practice

## 2015-05-22 ENCOUNTER — Telehealth: Payer: Self-pay

## 2015-05-22 DIAGNOSIS — N63 Unspecified lump in unspecified breast: Secondary | ICD-10-CM

## 2015-05-22 NOTE — Telephone Encounter (Signed)
Returned patient phone call to [phone] Patient not available Left message on voice mail to return our call

## 2015-05-22 NOTE — Telephone Encounter (Signed)
Patient called returning phone call. Please follow up.

## 2015-05-23 ENCOUNTER — Telehealth: Payer: Self-pay

## 2015-05-23 ENCOUNTER — Inpatient Hospital Stay: Admission: RE | Admit: 2015-05-23 | Payer: Self-pay | Source: Ambulatory Visit

## 2015-05-23 NOTE — Telephone Encounter (Signed)
Returned patient phone call Patient not available Left message on voice mail to return our call 

## 2015-05-24 ENCOUNTER — Telehealth: Payer: Self-pay

## 2015-05-24 NOTE — Telephone Encounter (Signed)
Returned patient phone call Patient stated she did not get her biopsy of her breast done Because she could not afford the out of pocket expense Patient does [facility]  This Tuesday 05/28/15/ at 2:30 in the afternoon Patient will keep [place] updated on her progress

## 2015-05-28 ENCOUNTER — Ambulatory Visit (HOSPITAL_COMMUNITY)
Admission: RE | Admit: 2015-05-28 | Discharge: 2015-05-28 | Disposition: A | Discharge status: Home / Self Care | Payer: Self-pay | Source: Ambulatory Visit | Attending: Obstetrics and Gynecology | Admitting: Obstetrics and Gynecology

## 2015-05-28 ENCOUNTER — Encounter (HOSPITAL_COMMUNITY): Payer: Self-pay

## 2015-05-28 VITALS — BP 118/72 | Temp 98.8°F | Ht 67.0 in | Wt 148.0 lb

## 2015-05-28 DIAGNOSIS — N6310 Unspecified lump in the right breast, unspecified quadrant: Secondary | ICD-10-CM

## 2015-05-28 NOTE — Progress Notes (Signed)
Patient ID: [name], female   DOB: Apr 20, 1976, 39 y.o.   MRN: [id number] Smoking cessation discussed with patient. Quit line information given to patient along with cancer center class information.

## 2015-05-28 NOTE — Progress Notes (Signed)
CLINIC:   [facility] [employer] [facility]  REASON FOR VISIT: Well-woman exam with routine gynecological exam  HISTORY OF PRESENT ILLNESS:   Ms. Florer is a 39 y.o. female who presents to the [facility] today for clinical breast exam and routine gynecological exam. Her last mammogram was on 05/13/2015 and was suspicious and a biopsy was recommended on the right breast. The patient has felt this mass herself for about 3 weeks. Her last pap smear was performed in August 2008 and was negative. History of abnormal pap 20 years ago and she had cryotherapy at that time. Patient is due for pap, but has her period today.   REVIEW OF SYSTEMS:   Denies breast pain, skin changes, nipple inversion, or nipple discharge bilaterally.  Denies any pelvic pain, pressure, or abnormal vaginal bleeding.   ALLERGIES: No Known Allergies  MEDICATIONS:  Current outpatient prescriptions:  .  hydrochlorothiazide (HYDRODIURIL) 25 MG tablet, Take 1 tablet (25 mg total) by mouth daily., Disp: 30 tablet, Rfl: 0 .  HYDROcodone-acetaminophen (NORCO/VICODIN) 5-325 MG per tablet, Take 1-2 tablets by mouth every 4 (four) hours as needed., Disp: 30 tablet, Rfl: 0 .  Ibuprofen-Diphenhydramine Cit (ADVIL PM) 200-38 MG TABS, Take 1 tablet by mouth at bedtime as needed (for sleep)., Disp: , Rfl:   PHYSICAL EXAM:   BP 118/72 mmHg  Temp(Src) 98.8 F (37.1 C) (Oral)  Ht  (1.702 m)  Wt 148 lb (67.132 kg)  BMI 23.17 kg/m2  LMP 04/12/2015 (Exact Date)  General: Well-nourished, well-appearing female in no acute distress.  She is accompanied by her daughter in clinic today.  Stoney Bang, LPN was present during physical exam for this patient.   Breasts: Bilateral breasts exposed and observed with patient standing (arms at side, arms on hips, arms on hips flexed forward, and arms over head).  No gross abnormalities including breast skin puckering or dimpling noted on observation.  Breasts symmetrical  without evidence of skin redness, thickening, or peau d'orange appearance. No nipple retraction or nipple discharge noted bilaterally.  No breast nodularity palpated in the left breast. Right breast mass noted at the 4 o'clock position measuring approximately 2 cm in diameter. Axillary lymph nodes: No axillary lymphadenopathy bilaterally.       ASSESSMENT & PLAN:  1. Breast cancer screening: Ms. Forry has a palpable breast abnormality in her right breast on her clinical breast exam today.  She will receive a right breast biopsy as scheduled on 05/29/15.  She will be contacted by the imaging center for results of the biopsy. She was given instructions and educational materials regarding breast self-awareness. Ms. Hartsough is aware of this plan and agrees with it.   2. Cervical cancer screening: Ms. Jamison is due for her annual pelvic exam and pap. She has her period today so we will plan to perform her pap on 06/20/15.     Ms. Heyer was encouraged to ask questions and all questions were answered to her satisfaction.      Clenton Pare, DNP, AGPCNP-BC, [facility] [phone]

## 2015-05-29 ENCOUNTER — Ambulatory Visit
Admission: RE | Admit: 2015-05-29 | Discharge: 2015-05-29 | Disposition: A | Discharge status: Home / Self Care | Payer: No Typology Code available for payment source | Source: Ambulatory Visit | Attending: Physician Assistant | Admitting: Physician Assistant

## 2015-05-29 ENCOUNTER — Other Ambulatory Visit: Payer: Self-pay | Admitting: Physician Assistant

## 2015-05-29 DIAGNOSIS — N63 Unspecified lump in unspecified breast: Secondary | ICD-10-CM

## 2015-06-20 ENCOUNTER — Inpatient Hospital Stay (HOSPITAL_COMMUNITY): Admission: RE | Admit: 2015-06-20 | Payer: Self-pay | Source: Ambulatory Visit

## 2015-08-28 NOTE — Addendum Note (Signed)
Encounter addended by: Priscille Heidelberghristine P Syenna Nazir, RN on: 08/28/2015  4:32 PM<BR>     Documentation filed: Charges VN

## 2016-02-03 ENCOUNTER — Encounter (HOSPITAL_BASED_OUTPATIENT_CLINIC_OR_DEPARTMENT_OTHER): Payer: Self-pay | Admitting: *Deleted

## 2016-02-03 ENCOUNTER — Emergency Department (HOSPITAL_BASED_OUTPATIENT_CLINIC_OR_DEPARTMENT_OTHER)
Admission: EM | Admit: 2016-02-03 | Discharge: 2016-02-03 | Disposition: A | Discharge status: Home / Self Care | Payer: Medicaid Other | Attending: Emergency Medicine | Admitting: Emergency Medicine

## 2016-02-03 DIAGNOSIS — Z3202 Encounter for pregnancy test, result negative: Secondary | ICD-10-CM | POA: Insufficient documentation

## 2016-02-03 DIAGNOSIS — Z202 Contact with and (suspected) exposure to infections with a predominantly sexual mode of transmission: Secondary | ICD-10-CM | POA: Diagnosis present

## 2016-02-03 DIAGNOSIS — I10 Essential (primary) hypertension: Secondary | ICD-10-CM | POA: Insufficient documentation

## 2016-02-03 DIAGNOSIS — F17210 Nicotine dependence, cigarettes, uncomplicated: Secondary | ICD-10-CM | POA: Insufficient documentation

## 2016-02-03 DIAGNOSIS — Z79899 Other long term (current) drug therapy: Secondary | ICD-10-CM | POA: Insufficient documentation

## 2016-02-03 DIAGNOSIS — Z9851 Tubal ligation status: Secondary | ICD-10-CM | POA: Diagnosis not present

## 2016-02-03 LAB — PREGNANCY, URINE: Preg Test, Ur: NEGATIVE

## 2016-02-03 LAB — URINALYSIS, ROUTINE W REFLEX MICROSCOPIC
BILIRUBIN URINE: NEGATIVE
Glucose, UA: NEGATIVE mg/dL
Ketones, ur: 15 mg/dL — AB
Leukocytes, UA: NEGATIVE
Nitrite: NEGATIVE
PROTEIN: NEGATIVE mg/dL
Specific Gravity, Urine: 1.023 (ref 1.005–1.030)
pH: 7 (ref 5.0–8.0)

## 2016-02-03 LAB — URINE MICROSCOPIC-ADD ON

## 2016-02-03 MED ORDER — CEFTRIAXONE SODIUM 250 MG IJ SOLR
250.0000 mg | Freq: Once | INTRAMUSCULAR | Status: AC
Start: 2016-02-03 — End: 2016-02-03
  Administered 2016-02-03: 250 mg via INTRAMUSCULAR
  Filled 2016-02-03: qty 250

## 2016-02-03 MED ORDER — AZITHROMYCIN 250 MG PO TABS
1000.0000 mg | ORAL_TABLET | Freq: Once | ORAL | Status: AC
  Administered 2016-02-03: 1000 mg via ORAL
  Filled 2016-02-03: qty 4

## 2016-02-03 MED ORDER — LIDOCAINE HCL (PF) 1 % IJ SOLN
INTRAMUSCULAR | Status: AC
  Administered 2016-02-03: 5 mL
  Filled 2016-02-03: qty 5
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
## 2016-02-04 LAB — RPR: RPR Ser Ql: NONREACTIVE

## 2016-02-04 LAB — HIV ANTIBODY (ROUTINE TESTING W REFLEX): HIV SCREEN 4TH GENERATION: NONREACTIVE

## 2016-04-30 ENCOUNTER — Emergency Department (HOSPITAL_COMMUNITY)
Admission: EM | Admit: 2016-04-30 | Discharge: 2016-04-30 | Disposition: A | Discharge status: Home / Self Care | Payer: Medicaid Other | Attending: Emergency Medicine | Admitting: Emergency Medicine

## 2016-04-30 ENCOUNTER — Encounter (HOSPITAL_COMMUNITY): Payer: Self-pay

## 2016-04-30 DIAGNOSIS — Y9389 Activity, other specified: Secondary | ICD-10-CM | POA: Diagnosis not present

## 2016-04-30 DIAGNOSIS — I10 Essential (primary) hypertension: Secondary | ICD-10-CM | POA: Diagnosis not present

## 2016-04-30 DIAGNOSIS — Y999 Unspecified external cause status: Secondary | ICD-10-CM | POA: Insufficient documentation

## 2016-04-30 DIAGNOSIS — R519 Headache, unspecified: Secondary | ICD-10-CM

## 2016-04-30 DIAGNOSIS — Y92410 Unspecified street and highway as the place of occurrence of the external cause: Secondary | ICD-10-CM | POA: Insufficient documentation

## 2016-04-30 DIAGNOSIS — R51 Headache: Secondary | ICD-10-CM | POA: Insufficient documentation

## 2016-04-30 DIAGNOSIS — F17200 Nicotine dependence, unspecified, uncomplicated: Secondary | ICD-10-CM | POA: Insufficient documentation

## 2016-04-30 NOTE — ED Notes (Signed)
PT DISCHARGED. INSTRUCTIONS GIVEN. AAOX4. PT IN NO APPARENT DISTRESS. THE OPPORTUNITY TO ASK QUESTIONS WAS PROVIDED. 
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
## 2016-11-03 ENCOUNTER — Encounter (HOSPITAL_BASED_OUTPATIENT_CLINIC_OR_DEPARTMENT_OTHER): Payer: Self-pay

## 2016-11-03 ENCOUNTER — Emergency Department (HOSPITAL_BASED_OUTPATIENT_CLINIC_OR_DEPARTMENT_OTHER)
Admission: EM | Admit: 2016-11-03 | Discharge: 2016-11-03 | Disposition: A | Discharge status: Home / Self Care | Payer: Medicaid Other | Attending: Emergency Medicine | Admitting: Emergency Medicine

## 2016-11-03 DIAGNOSIS — B349 Viral infection, unspecified: Secondary | ICD-10-CM | POA: Insufficient documentation

## 2016-11-03 DIAGNOSIS — I10 Essential (primary) hypertension: Secondary | ICD-10-CM | POA: Diagnosis not present

## 2016-11-03 DIAGNOSIS — F17200 Nicotine dependence, unspecified, uncomplicated: Secondary | ICD-10-CM | POA: Insufficient documentation

## 2016-11-03 DIAGNOSIS — R509 Fever, unspecified: Secondary | ICD-10-CM | POA: Diagnosis present

## 2016-11-03 LAB — COMPREHENSIVE METABOLIC PANEL
ALK PHOS: 83 U/L (ref 38–126)
ALT: 31 U/L (ref 14–54)
ANION GAP: 9 (ref 5–15)
AST: 31 U/L (ref 15–41)
Albumin: 4.6 g/dL (ref 3.5–5.0)
BILIRUBIN TOTAL: 1.1 mg/dL (ref 0.3–1.2)
BUN: 12 mg/dL (ref 6–20)
CALCIUM: 9.3 mg/dL (ref 8.9–10.3)
CO2: 26 mmol/L (ref 22–32)
Chloride: 102 mmol/L (ref 101–111)
Creatinine, Ser: 0.66 mg/dL (ref 0.44–1.00)
GLUCOSE: 99 mg/dL (ref 65–99)
Potassium: 3.6 mmol/L (ref 3.5–5.1)
Sodium: 137 mmol/L (ref 135–145)
TOTAL PROTEIN: 8.5 g/dL — AB (ref 6.5–8.1)

## 2016-11-03 LAB — URINALYSIS, ROUTINE W REFLEX MICROSCOPIC
Glucose, UA: NEGATIVE mg/dL
Hgb urine dipstick: NEGATIVE
KETONES UR: NEGATIVE mg/dL
LEUKOCYTES UA: NEGATIVE
NITRITE: NEGATIVE
PH: 7 (ref 5.0–8.0)
PROTEIN: 100 mg/dL — AB
Specific Gravity, Urine: 1.026 (ref 1.005–1.030)

## 2016-11-03 LAB — CBC
HCT: 40.9 % (ref 36.0–46.0)
HEMOGLOBIN: 14 g/dL (ref 12.0–15.0)
MCH: 32.4 pg (ref 26.0–34.0)
MCHC: 34.2 g/dL (ref 30.0–36.0)
MCV: 94.7 fL (ref 78.0–100.0)
Platelets: 300 10*3/uL (ref 150–400)
RBC: 4.32 MIL/uL (ref 3.87–5.11)
RDW: 12.8 % (ref 11.5–15.5)
WBC: 5.5 10*3/uL (ref 4.0–10.5)

## 2016-11-03 LAB — LIPASE, BLOOD: Lipase: 21 U/L (ref 11–51)

## 2016-11-03 LAB — URINALYSIS, MICROSCOPIC (REFLEX): WBC UA: NONE SEEN WBC/hpf (ref 0–5)

## 2016-11-03 LAB — RAPID STREP SCREEN (MED CTR MEBANE ONLY): Streptococcus, Group A Screen (Direct): NEGATIVE

## 2016-11-03 LAB — PREGNANCY, URINE: Preg Test, Ur: NEGATIVE

## 2016-11-03 MED ORDER — HYDROCHLOROTHIAZIDE 25 MG PO TABS: 25.0000 mg | ORAL_TABLET | Freq: Every day | ORAL | 0 refills | Status: DC

## 2016-11-03 MED ORDER — MORPHINE SULFATE (PF) 4 MG/ML IV SOLN
4.0000 mg | Freq: Once | INTRAVENOUS | Status: AC
  Administered 2016-11-03: 4 mg via INTRAVENOUS
  Filled 2016-11-03: qty 1

## 2016-11-03 MED ORDER — HYDROCHLOROTHIAZIDE 25 MG PO TABS
25.0000 mg | ORAL_TABLET | Freq: Once | ORAL | Status: AC
  Administered 2016-11-03: 25 mg via ORAL
  Filled 2016-11-03: qty 1

## 2016-11-03 MED ORDER — ONDANSETRON HCL 4 MG PO TABS: 4.0000 mg | ORAL_TABLET | Freq: Four times a day (QID) | ORAL | 0 refills | Status: DC

## 2016-11-03 MED ORDER — KETOROLAC TROMETHAMINE 30 MG/ML IJ SOLN
30.0000 mg | Freq: Once | INTRAMUSCULAR | Status: AC
  Administered 2016-11-03: 30 mg via INTRAVENOUS
  Filled 2016-11-03: qty 1

## 2016-11-03 MED ORDER — NAPROXEN 500 MG PO TABS: 500.0000 mg | ORAL_TABLET | Freq: Two times a day (BID) | ORAL | 0 refills | Status: DC

## 2016-11-03 NOTE — ED Notes (Signed)
Pt seated in WR-NAD

## 2016-11-03 NOTE — ED Triage Notes (Addendum)
C/o lower back pain-feels like she has UTI-denies dysuria-also c/o chills, body aches, sore throat -NAD-steady gait

## 2016-11-03 NOTE — ED Provider Notes (Signed)
[facility] Note   CSN: [id number] Arrival date & time: 11/03/16  1248   By signing my name below, I, Soijett Blue, attest that this documentation has been prepared under the direction and in the presence of Audry Piliyler Vaidehi Braddy, PA-C Electronically Signed: Soijett Blue, ED Scribe. 11/03/16. 6:04 PM.  History   Chief Complaint Chief Complaint  Patient presents with  . Back Pain    HPI [name] is a 41 y.o. female with a PMHx of HTN, who presents to the Emergency Department complaining of lower back pain onset 2 days ago. She is having associated symptoms of darkened urine, decreased urine output, generalized weakness, diaphoresis, decreased appetite, generalized weakness, subjective fever, chills, and sore throat. She hasn't tried any medications for the relief for her symptoms. Pt denies dysuria, bowel/bladder incontinence, and any other symptoms. Pt states that she used to take 25 mg HCTZ for her HTN, but she hasn't taken HCTZ for the past year.   The history is provided by the patient. No language interpreter was used.    Past Medical History:  Diagnosis Date  . Hypertension     There are no active problems to display for this patient.   Past Surgical History:  Procedure Laterality Date  . TUBAL LIGATION      OB History    Gravida Para Term Preterm AB Living   7 3 3   4 3    SAB TAB Ectopic Multiple Live Births   2 2             Home Medications    Prior to Admission medications   Medication Sig Start Date End Date Taking? Authorizing Provider  hydrochlorothiazide (HYDRODIURIL) 25 MG tablet Take 1 tablet (25 mg total) by mouth daily. 05/13/15   Eyvonne MechanicJeffrey Hedges, PA-C    Family History Family History  Problem Relation Age of Onset  . Cancer Father     throat and stomach  . Breast cancer Paternal Grandmother     Social History Social History  Substance Use Topics  . Smoking status: Current Some Day Smoker    Packs/day: 0.50  . Smokeless  tobacco: Never Used  . Alcohol use Yes     Comment: occ     Allergies   Patient has no known allergies.   Review of Systems Review of Systems  Constitutional: Positive for appetite change, chills, diaphoresis and fever (subjective).  HENT: Positive for sore throat.   Gastrointestinal:       No bowel incontinence.   Genitourinary: Positive for decreased urine volume. Negative for dysuria.       No bladder incontinence.   Musculoskeletal: Positive for back pain.  Neurological: Positive for weakness (generalized). Negative for numbness.   A complete 10 system review of systems was obtained and all systems are negative except as noted in the HPI and PMH.   Physical Exam Updated Vital Signs BP (!) 176/120 (BP Location: Left Arm)   Pulse 84   Temp 98.4 F (36.9 C) (Oral)   Resp 18   Ht 5\' 7"  (1.702 m)   Wt 160 lb (72.6 kg)   LMP 10/27/2016   SpO2 100%   BMI 25.06 kg/m   Physical Exam  Constitutional: She is oriented to person, place, and time. She appears well-developed and well-nourished. No distress.  HENT:  Head: Normocephalic and atraumatic.  Right Ear: Tympanic membrane, external ear and ear canal normal.  Left Ear: Tympanic membrane, external ear and ear canal normal.  Nose: Nose normal.  Mouth/Throat: Uvula is midline, oropharynx is clear and moist and mucous membranes are normal. No trismus in the jaw. No oropharyngeal exudate, posterior oropharyngeal erythema or tonsillar abscesses.  Eyes: EOM are normal. Pupils are equal, round, and reactive to light.  Neck: Normal range of motion. Neck supple. No tracheal deviation present.  Cardiovascular: Normal rate, regular rhythm, S1 normal, S2 normal, normal heart sounds, intact distal pulses and normal pulses.  Exam reveals no gallop and no friction rub.   No murmur heard. Pulmonary/Chest: Effort normal and breath sounds normal. No respiratory distress. She has no decreased breath sounds. She has no wheezes. She has no  rhonchi. She has no rales.  Abdominal: Soft. Normal appearance and bowel sounds are normal. She exhibits no distension. There is no tenderness. There is no CVA tenderness.  No CVA tenderness.  Musculoskeletal: Normal range of motion.  Neurological: She is alert and oriented to person, place, and time.  Skin: Skin is warm and dry.  Psychiatric: She has a normal mood and affect. Her speech is normal and behavior is normal. Thought content normal.  Nursing note and vitals reviewed.  ED Treatments / Results  DIAGNOSTIC STUDIES: Oxygen Saturation is 100% on RA, nl by my interpretation.    COORDINATION OF CARE: 6:02 PM Discussed treatment plan with pt at bedside which includes labs, UA, rapid strep screen and culture, labs, IV fluids, and pt agreed to plan.   Labs (all labs ordered are listed, but only abnormal results are displayed) Labs Reviewed  URINALYSIS, ROUTINE W REFLEX MICROSCOPIC - Abnormal; Notable for the following:       Result Value   Color, Urine AMBER (*)    Bilirubin Urine SMALL (*)    Protein, ur 100 (*)    All other components within normal limits  COMPREHENSIVE METABOLIC PANEL - Abnormal; Notable for the following:    Total Protein 8.5 (*)    All other components within normal limits  URINALYSIS, MICROSCOPIC (REFLEX) - Abnormal; Notable for the following:    Bacteria, UA RARE (*)    Squamous Epithelial / LPF 0-5 (*)    All other components within normal limits  RAPID STREP SCREEN (NOT AT [facility])  CULTURE, GROUP A STREP [facility])  PREGNANCY, URINE  LIPASE, BLOOD  CBC    Procedures Procedures (including critical care time)  Medications Ordered in ED Medications  hydrochlorothiazide (HYDRODIURIL) tablet 25 mg (25 mg Oral Given 11/03/16 1828)  morphine 4 MG/ML injection 4 mg (4 mg Intravenous Given 11/03/16 1827)     Initial Impression / Assessment and Plan / ED Course  I have reviewed the triage vital signs and the nursing notes.  Pertinent labs that were  available during my care of the patient were reviewed by me and considered in my medical decision making (see chart for details).    I have reviewed and evaluated the relevant laboratory values. I have reviewed the relevant previous healthcare records. I obtained HPI from historian.  ED Course:  Assessment: Pt is a 40yF presents with generalized weakness, abdominal pain, nausea, cough x2 days. Subjective fevers. On exam, pt in NAD. VSS. Afebrile. Lungs CTA, Heart RRR. Abdomen nontender/soft. CBC unremarkable. CMP unremarkable. UA unremarkable. Patients symptoms are consistent with viral syndrome. possible influenza. Discussed that antibiotics are not indicated for viral infections. Improved in ED with fluids and antiemetics. Pt will be discharged with symptomatic treatment.  Verbalizes understanding and is agreeable with plan. Also given Rx HCTZ 25mg  as pt out  of medication. Given 30 day supply. Pt is hemodynamically stable & in NAD prior to dc.  Disposition/Plan:  DC home Additional Verbal discharge instructions given and discussed with patient.  Pt Instructed to f/u with PCP in the next week for evaluation and treatment of symptoms. Return precautions given Pt acknowledges and agrees with plan  Supervising Physician Canary Brim Tegeler, MD  Final Clinical Impressions(s) / ED Diagnoses   Final diagnoses:  Viral syndrome    New Prescriptions New Prescriptions   No medications on file    I personally performed the services described in this documentation, which was scribed in my presence. The recorded information has been reviewed and is accurate.    Audry Pili, PA-C 11/04/16 1610    Canary Brim Tegeler, MD 11/04/16 1005
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
## 2016-11-06 LAB — CULTURE, GROUP A STREP (THRC)

## 2016-12-13 ENCOUNTER — Encounter (HOSPITAL_COMMUNITY): Payer: Self-pay

## 2016-12-13 ENCOUNTER — Emergency Department (HOSPITAL_COMMUNITY)
Admission: EM | Admit: 2016-12-13 | Discharge: 2016-12-13 | Disposition: A | Discharge status: Home / Self Care | Payer: Medicaid Other | Attending: Emergency Medicine | Admitting: Emergency Medicine

## 2016-12-13 DIAGNOSIS — J020 Streptococcal pharyngitis: Secondary | ICD-10-CM | POA: Diagnosis not present

## 2016-12-13 DIAGNOSIS — J029 Acute pharyngitis, unspecified: Secondary | ICD-10-CM | POA: Diagnosis present

## 2016-12-13 DIAGNOSIS — I10 Essential (primary) hypertension: Secondary | ICD-10-CM | POA: Diagnosis not present

## 2016-12-13 DIAGNOSIS — F17200 Nicotine dependence, unspecified, uncomplicated: Secondary | ICD-10-CM | POA: Insufficient documentation

## 2016-12-13 LAB — RAPID STREP SCREEN (MED CTR MEBANE ONLY): Streptococcus, Group A Screen (Direct): POSITIVE — AB

## 2016-12-13 MED ORDER — PENICILLIN G BENZATHINE 1200000 UNIT/2ML IM SUSP
1.2000 10*6.[IU] | Freq: Once | INTRAMUSCULAR | Status: AC
  Administered 2016-12-13: 1.2 10*6.[IU] via INTRAMUSCULAR
  Filled 2016-12-13: qty 2

## 2016-12-13 NOTE — ED Notes (Signed)
Patient left at this time with all belongings. 

## 2016-12-13 NOTE — ED Triage Notes (Signed)
Patient complains of 2 days of severe sore throat x 2 days, pain with swallowing, NAD

## 2016-12-13 NOTE — ED Provider Notes (Signed)
[facility]-EMERGENCY DEPT Provider Note   CSN: [id number] Arrival date & time: 12/13/16  1722  By signing my name below, I, [name], attest that this documentation has been prepared under the direction and in the presence of Rolan Bucco, MD. Electronically Signed: Doreatha Powell, ED Scribe. 12/13/16. 6:48 PM.     History   Chief Complaint No chief complaint on file.   HPI [name] is a 41 y.o. female who presents to the Emergency Department complaining of gradually worsening sore throat x 2 days with associated chills. Pt states she has taken Tylenol PM with no relief. She states her pain is worsened with swallowing. She denies known fever at home, vomiting, diarrhea, rhinorrhea, congestion, difficulty swallowing or tolerating secretions.    The history is provided by the patient. No language interpreter was used.    Past Medical History:  Diagnosis Date  . Hypertension     There are no active problems to display for this patient.   Past Surgical History:  Procedure Laterality Date  . TUBAL LIGATION      OB History    Gravida Para Term Preterm AB Living   7 3 3   4 3    SAB TAB Ectopic Multiple Live Births   2 2             Home Medications    Prior to Admission medications   Medication Sig Start Date End Date Taking? Authorizing Provider  hydrochlorothiazide (HYDRODIURIL) 25 MG tablet Take 1 tablet (25 mg total) by mouth daily. 11/03/16   Audry Pili, PA-C  naproxen (NAPROSYN) 500 MG tablet Take 1 tablet (500 mg total) by mouth 2 (two) times daily. 11/03/16   Audry Pili, PA-C  ondansetron (ZOFRAN) 4 MG tablet Take 1 tablet (4 mg total) by mouth every 6 (six) hours. 11/03/16   Audry Pili, PA-C    Family History Family History  Problem Relation Age of Onset  . Cancer Father     throat and stomach  . Breast cancer Paternal Grandmother     Social History Social History  Substance Use Topics  . Smoking status: Current Some Day Smoker    Packs/day: 0.50  .  Smokeless tobacco: Never Used  . Alcohol use Yes     Comment: occ     Allergies   Patient has no known allergies.   Review of Systems Review of Systems  Constitutional: Positive for chills. Negative for fever.  HENT: Positive for sore throat. Negative for congestion, rhinorrhea and trouble swallowing.   Gastrointestinal: Negative for diarrhea, nausea and vomiting.  Musculoskeletal: Negative for back pain and neck pain.  Skin: Negative for wound.  Neurological: Negative for weakness, numbness and headaches.     Physical Exam Updated Vital Signs BP 131/87 (BP Location: Left Arm)   Pulse 101   Temp 100 F (37.8 C) (Oral)   Resp 20   SpO2 100%   Physical Exam  Constitutional: She is oriented to person, place, and time. She appears well-developed and well-nourished.  HENT:  Head: Normocephalic and atraumatic.  Mouth/Throat: No oropharyngeal exudate.  Oropharynx erythematous, no exudates, Uvula midline. No trismus.   Neck: Normal range of motion. Neck supple.  Cervical lymphadenopathy bilaterally.   Cardiovascular: Normal rate.   Pulmonary/Chest: Effort normal.  Musculoskeletal: Normal range of motion.  Lymphadenopathy:    She has cervical adenopathy.  Neurological: She is alert and oriented to person, place, and time.  Skin: Skin is warm and dry.  Psychiatric: She has  a normal mood and affect.  Nursing note and vitals reviewed.    ED Treatments / Results   DIAGNOSTIC STUDIES: Oxygen Saturation is 100% on RA, normal by my interpretation.    COORDINATION OF CARE: 6:41 PM Discussed treatment plan with pt at bedside which includes rapid strep, IM Bicillin and pt agreed to plan.    Labs (all labs ordered are listed, but only abnormal results are displayed) Labs Reviewed  RAPID STREP SCREEN (NOT AT [facility]) - Abnormal; Notable for the following:       Result Value   Streptococcus, Group A Screen (Direct) POSITIVE (*)    All other components within normal limits     EKG  EKG Interpretation None       Radiology No results found.  Procedures Procedures (including critical care time)  Medications Ordered in ED Medications  penicillin g benzathine (BICILLIN LA) 1200000 UNIT/2ML injection 1.2 Million Units (not administered)     Initial Impression / Assessment and Plan / ED Course  I have reviewed the triage vital signs and the nursing notes.  Pertinent labs & imaging results that were available during my care of the patient were reviewed by me and considered in my medical decision making (see chart for details).     Pt rapid strep test positive. Pt is tolerating secretions. Presentation not concerning for peritonsillar abscess or spread of infection to deep spaces of the throat; patent airway. Pt given IM Bicillin in the ED. Specific return precautions discussed. Recommended PCP follow up. Pt appears safe for discharge.     Final Clinical Impressions(s) / ED Diagnoses   Final diagnoses:  Streptococcal sore throat    New Prescriptions New Prescriptions   No medications on file    I personally performed the services described in this documentation, which was scribed in my presence.  The recorded information has been reviewed and considered.    Rolan BuccoMelanie Meia Emley, MD 12/13/16 [phone]

## 2016-12-14 DIAGNOSIS — Z79899 Other long term (current) drug therapy: Secondary | ICD-10-CM | POA: Insufficient documentation

## 2016-12-14 DIAGNOSIS — J029 Acute pharyngitis, unspecified: Secondary | ICD-10-CM | POA: Insufficient documentation

## 2016-12-14 DIAGNOSIS — I10 Essential (primary) hypertension: Secondary | ICD-10-CM | POA: Insufficient documentation

## 2016-12-14 DIAGNOSIS — F17200 Nicotine dependence, unspecified, uncomplicated: Secondary | ICD-10-CM | POA: Diagnosis not present

## 2016-12-15 ENCOUNTER — Emergency Department (HOSPITAL_COMMUNITY)
Admission: EM | Admit: 2016-12-15 | Discharge: 2016-12-15 | Disposition: A | Discharge status: Home / Self Care | Payer: Medicaid Other | Attending: Emergency Medicine | Admitting: Emergency Medicine

## 2016-12-15 ENCOUNTER — Encounter (HOSPITAL_COMMUNITY): Payer: Self-pay

## 2016-12-15 DIAGNOSIS — J029 Acute pharyngitis, unspecified: Secondary | ICD-10-CM

## 2016-12-15 MED ORDER — AMOXICILLIN 500 MG PO CAPS: 500.0000 mg | ORAL_CAPSULE | Freq: Three times a day (TID) | ORAL | 0 refills | Status: DC

## 2016-12-15 MED ORDER — DEXAMETHASONE SODIUM PHOSPHATE 10 MG/ML IJ SOLN
10.0000 mg | Freq: Once | INTRAMUSCULAR | Status: AC
Start: 2016-12-15 — End: 2016-12-15
  Administered 2016-12-15: 10 mg via INTRAMUSCULAR
  Filled 2016-12-15: qty 1

## 2016-12-15 NOTE — ED Notes (Signed)
PA at bedside.

## 2016-12-15 NOTE — ED Triage Notes (Signed)
Pt complaining of sore throat and ear pain x 3 days. Pt states seen here yesterday, received abx, no improvement. Pt complaining of worsening ear and throat pain. Pt states painful to swallow.

## 2016-12-15 NOTE — ED Provider Notes (Signed)
[facility]-EMERGENCY DEPT Provider Note   CSN: [id number] Arrival date & time: 12/14/16  2356     History   Chief Complaint Chief Complaint  Patient presents with  . Otalgia  . Sore Throat    HPI [name] is a 41 y.o. female.  Patient presents emergency department with chief complaint of sore throat. She states that she was seen 2 days ago with the same complaint. She states that she was tested and diagnosed with strep throat. She is given a shot of penicillin and discharge to home. She reports worsening symptoms now. She denies any new fevers today. She states that she has been taking ibuprofen with little relief. Reports pain with swallowing, but denies inability to swallow. She denies any other associated symptoms.   The history is provided by the patient. No language interpreter was used.    Past Medical History:  Diagnosis Date  . Hypertension     There are no active problems to display for this patient.   Past Surgical History:  Procedure Laterality Date  . TUBAL LIGATION      OB History    Gravida Para Term Preterm AB Living   7 3 3   4 3    SAB TAB Ectopic Multiple Live Births   2 2             Home Medications    Prior to Admission medications   Medication Sig Start Date End Date Taking? Authorizing Provider  amoxicillin (AMOXIL) 500 MG capsule Take 1 capsule (500 mg total) by mouth 3 (three) times daily. 12/15/16   Roxy Horsemanobert Najma Bozarth, PA-C  hydrochlorothiazide (HYDRODIURIL) 25 MG tablet Take 1 tablet (25 mg total) by mouth daily. 11/03/16   Audry Piliyler Mohr, PA-C  naproxen (NAPROSYN) 500 MG tablet Take 1 tablet (500 mg total) by mouth 2 (two) times daily. 11/03/16   Audry Piliyler Mohr, PA-C  ondansetron (ZOFRAN) 4 MG tablet Take 1 tablet (4 mg total) by mouth every 6 (six) hours. 11/03/16   Audry Piliyler Mohr, PA-C    Family History Family History  Problem Relation Age of Onset  . Cancer Father     throat and stomach  . Breast cancer Paternal Grandmother     Social  History Social History  Substance Use Topics  . Smoking status: Current Some Day Smoker    Packs/day: 0.50  . Smokeless tobacco: Never Used  . Alcohol use Yes     Comment: occ     Allergies   Patient has no known allergies.   Review of Systems Review of Systems  All other systems reviewed and are negative.    Physical Exam Updated Vital Signs BP (!) 157/104   Pulse 85   Temp 98.7 F (37.1 C) (Oral)   Resp 16   LMP 12/15/2016 (Exact Date)   SpO2 99%   Physical Exam  Constitutional: She is oriented to person, place, and time. She appears well-developed and well-nourished.  HENT:  Head: Normocephalic and atraumatic.  Bilateral tympanic membranes are clear Oropharynx is moderately erythematous, mild exudates, no evidence of abscess, no stridor, uvula is midline, airway intact  Eyes: Conjunctivae and EOM are normal. Pupils are equal, round, and reactive to light.  Neck: Normal range of motion. Neck supple.  Positive anterior cervical adenopathy  Cardiovascular: Normal rate and regular rhythm.  Exam reveals no gallop and no friction rub.   No murmur heard. Pulmonary/Chest: Effort normal and breath sounds normal. No respiratory distress. She has no wheezes. She has no  rales. She exhibits no tenderness.  Abdominal: Soft. Bowel sounds are normal. She exhibits no distension and no mass. There is no tenderness. There is no rebound and no guarding.  Musculoskeletal: Normal range of motion. She exhibits no edema or tenderness.  Neurological: She is alert and oriented to person, place, and time.  Skin: Skin is warm and dry.  Psychiatric: She has a normal mood and affect. Her behavior is normal. Judgment and thought content normal.  Nursing note and vitals reviewed.    ED Treatments / Results  Labs (all labs ordered are listed, but only abnormal results are displayed) Labs Reviewed - No data to display  EKG  EKG Interpretation None       Radiology No results  found.  Procedures Procedures (including critical care time)  Medications Ordered in ED Medications  dexamethasone (DECADRON) injection 10 mg (not administered)     Initial Impression / Assessment and Plan / ED Course  I have reviewed the triage vital signs and the nursing notes.  Pertinent labs & imaging results that were available during my care of the patient were reviewed by me and considered in my medical decision making (see chart for details).     Patient recently diagnosed with strep throat. She denies any improvement. She states that mostly she has persistent pain. I've encouraged her to continue Tylenol and ibuprofen. Recommend increasing fluids. I will give her a shot of Decadron which should help decrease the swelling and may help with her pain. As she reports no improvement after the penicillin shot, she may also benefit from a weeklong course of amoxicillin.  Final Clinical Impressions(s) / ED Diagnoses   Final diagnoses:  Sore throat    New Prescriptions New Prescriptions   AMOXICILLIN (AMOXIL) 500 MG CAPSULE    Take 1 capsule (500 mg total) by mouth 3 (three) times daily.     Roxy Horseman, PA-C 12/15/16 0134    Laurence Spates, MD 12/15/16 [phone]

## 2017-01-15 ENCOUNTER — Encounter (HOSPITAL_COMMUNITY): Payer: Self-pay | Admitting: *Deleted

## 2017-06-18 ENCOUNTER — Emergency Department (HOSPITAL_COMMUNITY): Payer: Self-pay

## 2017-06-18 ENCOUNTER — Encounter (HOSPITAL_COMMUNITY): Payer: Self-pay | Admitting: *Deleted

## 2017-06-18 ENCOUNTER — Emergency Department (HOSPITAL_COMMUNITY)
Admission: EM | Admit: 2017-06-18 | Discharge: 2017-06-18 | Disposition: A | Discharge status: Home / Self Care | Payer: Self-pay | Attending: Emergency Medicine | Admitting: Emergency Medicine

## 2017-06-18 DIAGNOSIS — R519 Headache, unspecified: Secondary | ICD-10-CM

## 2017-06-18 DIAGNOSIS — I160 Hypertensive urgency: Secondary | ICD-10-CM | POA: Insufficient documentation

## 2017-06-18 DIAGNOSIS — Z9114 Patient's other noncompliance with medication regimen: Secondary | ICD-10-CM | POA: Insufficient documentation

## 2017-06-18 DIAGNOSIS — F17210 Nicotine dependence, cigarettes, uncomplicated: Secondary | ICD-10-CM | POA: Insufficient documentation

## 2017-06-18 DIAGNOSIS — R51 Headache: Secondary | ICD-10-CM | POA: Insufficient documentation

## 2017-06-18 DIAGNOSIS — Z79899 Other long term (current) drug therapy: Secondary | ICD-10-CM | POA: Insufficient documentation

## 2017-06-18 LAB — URINALYSIS, ROUTINE W REFLEX MICROSCOPIC
Bilirubin Urine: NEGATIVE
GLUCOSE, UA: NEGATIVE mg/dL
Hgb urine dipstick: NEGATIVE
Ketones, ur: NEGATIVE mg/dL
LEUKOCYTES UA: NEGATIVE
Nitrite: NEGATIVE
PH: 7 (ref 5.0–8.0)
PROTEIN: NEGATIVE mg/dL
SPECIFIC GRAVITY, URINE: 1.005 (ref 1.005–1.030)

## 2017-06-18 LAB — BASIC METABOLIC PANEL
Anion gap: 8 (ref 5–15)
BUN: 11 mg/dL (ref 6–20)
CO2: 25 mmol/L (ref 22–32)
Calcium: 9.1 mg/dL (ref 8.9–10.3)
Chloride: 104 mmol/L (ref 101–111)
Creatinine, Ser: 0.67 mg/dL (ref 0.44–1.00)
GFR calc Af Amer: >60 mL/min (ref >60)
GFR calc non Af Amer: >60 mL/min (ref >60)
Glucose, Bld: 93 mg/dL (ref 65–99)
Potassium: 3.5 mmol/L (ref 3.5–5.1)
Sodium: 137 mmol/L (ref 135–145)

## 2017-06-18 LAB — CBC
HCT: 38.3 % (ref 36.0–46.0)
Hemoglobin: 13 g/dL (ref 12.0–15.0)
MCH: 31.9 pg (ref 26.0–34.0)
MCHC: 33.9 g/dL (ref 30.0–36.0)
MCV: 93.9 fL (ref 78.0–100.0)
Platelets: 286 10*3/uL (ref 150–400)
RBC: 4.08 MIL/uL (ref 3.87–5.11)
RDW: 13.3 % (ref 11.5–15.5)
WBC: 6.4 10*3/uL (ref 4.0–10.5)

## 2017-06-18 LAB — I-STAT TROPONIN, ED: Troponin i, poc: 0 ng/mL (ref 0.00–0.08)

## 2017-06-18 LAB — POC URINE PREG, ED: PREG TEST UR: NEGATIVE

## 2017-06-18 MED ORDER — AMLODIPINE BESYLATE 5 MG PO TABS
5.0000 mg | ORAL_TABLET | Freq: Once | ORAL | Status: AC
  Administered 2017-06-18: 5 mg via ORAL
  Filled 2017-06-18: qty 1

## 2017-06-18 MED ORDER — AMLODIPINE BESYLATE 5 MG PO TABS: 5.0000 mg | ORAL_TABLET | Freq: Every day | ORAL | 0 refills | Status: DC

## 2017-06-18 MED ORDER — PROCHLORPERAZINE EDISYLATE 5 MG/ML IJ SOLN
10.0000 mg | Freq: Once | INTRAMUSCULAR | Status: AC
  Administered 2017-06-18: 10 mg via INTRAVENOUS
  Filled 2017-06-18: qty 2

## 2017-06-18 MED ORDER — HYDROCHLOROTHIAZIDE 25 MG PO TABS: 25.0000 mg | ORAL_TABLET | Freq: Every day | ORAL | 0 refills | Status: DC

## 2017-06-18 MED ORDER — HYDROCHLOROTHIAZIDE 25 MG PO TABS
25.0000 mg | ORAL_TABLET | Freq: Once | ORAL | Status: AC
  Administered 2017-06-18: 25 mg via ORAL
  Filled 2017-06-18: qty 1

## 2017-06-18 MED ORDER — SODIUM CHLORIDE 0.9 % IV BOLUS (SEPSIS)
1000.0000 mL | Freq: Once | INTRAVENOUS | Status: AC
  Administered 2017-06-18: 1000 mL via INTRAVENOUS

## 2017-06-18 MED ORDER — DIPHENHYDRAMINE HCL 50 MG/ML IJ SOLN
25.0000 mg | Freq: Once | INTRAMUSCULAR | Status: AC
  Administered 2017-06-18: 25 mg via INTRAVENOUS
  Filled 2017-06-18: qty 1

## 2017-06-18 MED ORDER — KETOROLAC TROMETHAMINE 30 MG/ML IJ SOLN
30.0000 mg | Freq: Once | INTRAMUSCULAR | Status: AC
  Administered 2017-06-18: 30 mg via INTRAVENOUS
  Filled 2017-06-18: qty 1

## 2017-06-18 NOTE — ED Notes (Signed)
Patient transported to X-ray 

## 2017-06-18 NOTE — ED Notes (Signed)
PT still in radiology dept

## 2017-06-18 NOTE — ED Provider Notes (Signed)
[facility] Provider Note   CSN: [id number] Arrival date & time: 06/18/17  1002     History   Chief Complaint Chief Complaint  Patient presents with  . Headache  . Hypertension    HPI [name] is a 41 y.o. female  With h/o HTN presents to ED For evaluation of elevated blood pressure systolic in the 200s associated with right occipital headache 2 days. She is supposed to take hydrochlorothiazide but has not been compliant for the last 8 months. She cannot remember if the headache wasn't sudden or gradual. He has not tried anything for headache. Denies fevers, visual changes, slurred speech, focal weakness or numbness, chest pain, shortness of breath, lightheadedness, palpitations, lower extremity edema, abdominal pain or back pain. Teary-eyed during encounter, states she has 3 young children she is worried that she might have a stroke. Current every day smoker, half a pack a day. Smokes marijuana daily. Denies any other illicit drug use or heavy EtOH use.  HPI  Past Medical History:  Diagnosis Date  . Hypertension     There are no active problems to display for this patient.   Past Surgical History:  Procedure Laterality Date  . TUBAL LIGATION      OB History    Gravida Para Term Preterm AB Living   7 3 3   4 3    SAB TAB Ectopic Multiple Live Births   2 2             Home Medications    Prior to Admission medications   Medication Sig Start Date End Date Taking? Authorizing Provider  amLODipine (NORVASC) 5 MG tablet Take 1 tablet (5 mg total) by mouth daily. 06/18/17   Liberty HandyGibbons, Quaneisha Hanisch J, PA-C  hydrochlorothiazide (HYDRODIURIL) 25 MG tablet Take 1 tablet (25 mg total) by mouth daily. 06/18/17   Liberty HandyGibbons, Alfonsa Vaile J, PA-C  naproxen (NAPROSYN) 500 MG tablet Take 1 tablet (500 mg total) by mouth 2 (two) times daily. Patient not taking: Reported on 06/18/2017 11/03/16   Audry PiliMohr, Tyler, PA-C  ondansetron (ZOFRAN) 4 MG tablet Take 1 tablet (4 mg total) by mouth every 6  (six) hours. Patient not taking: Reported on 06/18/2017 11/03/16   Audry PiliMohr, Tyler, PA-C    Family History Family History  Problem Relation Age of Onset  . Cancer Father        throat and stomach  . Breast cancer Paternal Grandmother     Social History Social History  Substance Use Topics  . Smoking status: Current Some Day Smoker    Packs/day: 0.50  . Smokeless tobacco: Never Used  . Alcohol use Yes     Comment: occ     Allergies   Patient has no known allergies.   Review of Systems Review of Systems  Constitutional: Negative for chills, diaphoresis and fever.  Eyes: Negative for photophobia and visual disturbance.  Respiratory: Negative for cough, chest tightness and shortness of breath.   Cardiovascular: Negative for chest pain, palpitations and leg swelling.  Gastrointestinal: Negative for abdominal pain, nausea and vomiting.  Genitourinary: Negative for dysuria.  Musculoskeletal: Negative for back pain.  Neurological: Positive for headaches. Negative for syncope, speech difficulty, weakness, light-headedness and numbness.     Physical Exam Updated Vital Signs BP (!) 157/100   Pulse 74   Temp 98.5 F (36.9 C) (Oral)   Resp 17   LMP 05/26/2017 (Approximate)   SpO2 100%   Physical Exam  Constitutional: She is oriented to person, place, and  time. She appears well-developed and well-nourished. No distress.  HENT:  Head: Normocephalic and atraumatic.  Nose: Nose normal.  Mouth/Throat: Oropharynx is clear and moist. No oropharyngeal exudate.  Eyes: Conjunctivae are normal.  Unable to visualize fundus   Neck: Normal range of motion. Neck supple.  Cardiovascular: Normal rate, regular rhythm, S1 normal, S2 normal, normal heart sounds and intact distal pulses.   No murmur heard. Pulses:      Carotid pulses are 2+ on the right side, and 2+ on the left side.      Radial pulses are 2+ on the right side, and 2+ on the left side.       Femoral pulses are 2+ on the right  side, and 2+ on the left side.      Popliteal pulses are 2+ on the right side, and 2+ on the left side.       Dorsalis pedis pulses are 2+ on the right side, and 2+ on the left side.       Posterior tibial pulses are 2+ on the right side, and 2+ on the left side.  No LE edema No carotid bruits  Pulmonary/Chest: Effort normal and breath sounds normal. No respiratory distress. She has no decreased breath sounds. She has no wheezes. She exhibits no tenderness.  No orthopnea   Abdominal: Soft. Bowel sounds are normal. She exhibits no distension and no mass. There is no tenderness. There is no rebound and no guarding.  Musculoskeletal: Normal range of motion. She exhibits no deformity.  Lymphadenopathy:    She has no cervical adenopathy.  Neurological: She is alert and oriented to person, place, and time. No sensory deficit.  A&O to self, place and time. Speech and phonation normal. Thought process coherent.   Strength 5/5 in upper and lower extremities.   Sensation to light touch intact in upper and lower extremities.  Gait normal/no truncal sway.   Negative Romberg. No leg drift.  Intact finger to nose test. CN I not tested CN II full visual fields  CN III, IV, VI PEERL and EOMs intact bilaterally CN V light touch intact in all 3 divisions of trigeminal nerve CN VII facial nerve movements intact, symmetric, bilaterally CN VIII hearing intact to finger rub, bilaterally CN IX, X no uvula deviation, symmetric soft palate rise CN XI 5/5 SCM and trapezius strength bilaterally  CN XII Tongue midline with symmetric L/R movement  Skin: Skin is warm and dry. Capillary refill takes less than 2 seconds.  Psychiatric: She has a normal mood and affect. Her behavior is normal. Judgment and thought content normal.  Nursing note and vitals reviewed.    ED Treatments / Results  Labs (all labs ordered are listed, but only abnormal results are displayed) Labs Reviewed  URINALYSIS, ROUTINE W REFLEX  MICROSCOPIC - Abnormal; Notable for the following:       Result Value   Color, Urine COLORLESS (*)    All other components within normal limits  BASIC METABOLIC PANEL  CBC  I-STAT TROPONIN, ED  POC URINE PREG, ED    EKG  EKG Interpretation None       Radiology Dg Chest 2 View  Result Date: 06/18/2017 CLINICAL DATA:  Hypertensive urgency EXAM: CHEST  2 VIEW COMPARISON:  07/15/2011 FINDINGS: The heart size and mediastinal contours are within normal limits. Both lungs are clear. The visualized skeletal structures are unremarkable. IMPRESSION: No active cardiopulmonary disease. Electronically Signed   By: Signa Kell M.D.   On: 06/18/2017  11:57   Ct Head Wo Contrast  Result Date: 06/18/2017 CLINICAL DATA:  Frontal headache for 2 days EXAM: CT HEAD WITHOUT CONTRAST TECHNIQUE: Contiguous axial images were obtained from the base of the skull through the vertex without intravenous contrast. COMPARISON:  02/05/2007 FINDINGS: Brain: No acute intracranial abnormality. Specifically, no hemorrhage, hydrocephalus, mass lesion, acute infarction, or significant intracranial injury. Vascular: No hyperdense vessel or unexpected calcification. Skull: No acute calvarial abnormality. Sinuses/Orbits: Visualized paranasal sinuses and mastoids clear. Orbital soft tissues unremarkable. Other: None IMPRESSION: Normal study. Electronically Signed   By: Charlett Nose M.D.   On: 06/18/2017 12:15    Procedures Procedures (including critical care time)  Medications Ordered in ED Medications  sodium chloride 0.9 % bolus 1,000 mL (0 mLs Intravenous Stopped 06/18/17 1357)  ketorolac (TORADOL) 30 MG/ML injection 30 mg (30 mg Intravenous Given 06/18/17 1217)  prochlorperazine (COMPAZINE) injection 10 mg (10 mg Intravenous Given 06/18/17 1219)  diphenhydrAMINE (BENADRYL) injection 25 mg (25 mg Intravenous Given 06/18/17 1216)  amLODipine (NORVASC) tablet 5 mg (5 mg Oral Given 06/18/17 1341)  hydrochlorothiazide (HYDRODIURIL)  tablet 25 mg (25 mg Oral Given 06/18/17 1341)     Initial Impression / Assessment and Plan / ED Course  I have reviewed the triage vital signs and the nursing notes.  Pertinent labs & imaging results that were available during my care of the patient were reviewed by me and considered in my medical decision making (see chart for details).   41 yo female with h/o HTN and tobacco abuse presents to ED for elevated BP reading and headache. Non compliant with HCTZ for 8 months. Unable to determine if headache was gradual or sudden but states today's headache is worse than yesterday's. Denies visual changes, CP, SOB, weakness, back pain, abdominal pain. No illicit drug use.   CBC, BMP, trop x 1, U/A all negative. CXR without edema. EKG w/o ischemia. CT head w/o acute process.   Pt was given migraine cocktail and oral antihypertensive. She had adequate decrease in BP. Headache relieved.  Will d/c with HCTZ and norvasc and referral to community clinic to establish care with PCP for long term management of HTN. Discussed return precautions. She verbalized understanding and is agreeable with plan.   Final Clinical Impressions(s) / ED Diagnoses   Final diagnoses:  Hypertensive urgency  Bad headache    New Prescriptions Discharge Medication List as of 06/18/2017  1:45 PM    START taking these medications   Details  amLODipine (NORVASC) 5 MG tablet Take 1 tablet (5 mg total) by mouth daily., Starting Fri 06/18/2017, Print         Liberty Handy, PA-C 06/18/17 1558    Raeford Razor, MD 06/21/17 1201

## 2017-06-18 NOTE — ED Triage Notes (Signed)
Pt reports having severe posterior head pain x 2 days, more severe on right side of head. Has hx of HTN but has not been taking her meds. Is tearful at triage, no acute distress is noted.

## 2017-06-18 NOTE — Discharge Instructions (Signed)
You were evaluated in the emergency department for elevated blood pressure and headache. Her workup was reassuring. Your CT scan was normal. Your blood pressure normalized after blood pressure medications.  Please take hydrochlorothiazide and amlodipine daily, as prescribed. Monitor your blood pressure daily.   Contact [facility] to establish care with a primary care provider for regular, routine medical care.  This clinic accepts patients without medical insurance. A primary care provider can adjust your daily medications and give you refills.

## 2018-02-09 ENCOUNTER — Emergency Department (HOSPITAL_COMMUNITY)
Admission: EM | Admit: 2018-02-09 | Discharge: 2018-02-09 | Disposition: A | Discharge status: Home / Self Care | Payer: Self-pay | Attending: Emergency Medicine | Admitting: Emergency Medicine

## 2018-02-09 ENCOUNTER — Encounter (HOSPITAL_COMMUNITY): Payer: Self-pay | Admitting: *Deleted

## 2018-02-09 DIAGNOSIS — M545 Low back pain: Secondary | ICD-10-CM | POA: Insufficient documentation

## 2018-02-09 DIAGNOSIS — I10 Essential (primary) hypertension: Secondary | ICD-10-CM | POA: Insufficient documentation

## 2018-02-09 DIAGNOSIS — R300 Dysuria: Secondary | ICD-10-CM | POA: Insufficient documentation

## 2018-02-09 DIAGNOSIS — F17210 Nicotine dependence, cigarettes, uncomplicated: Secondary | ICD-10-CM | POA: Insufficient documentation

## 2018-02-09 DIAGNOSIS — R531 Weakness: Secondary | ICD-10-CM | POA: Insufficient documentation

## 2018-02-09 LAB — CBC WITH DIFFERENTIAL/PLATELET
BASOS ABS: 0 10*3/uL (ref 0.0–0.1)
BASOS PCT: 0 %
EOS PCT: 2 %
Eosinophils Absolute: 0.1 10*3/uL (ref 0.0–0.7)
HCT: 41.2 % (ref 36.0–46.0)
Hemoglobin: 13.9 g/dL (ref 12.0–15.0)
LYMPHS PCT: 34 %
Lymphs Abs: 2.6 10*3/uL (ref 0.7–4.0)
MCH: 32.8 pg (ref 26.0–34.0)
MCHC: 33.7 g/dL (ref 30.0–36.0)
MCV: 97.2 fL (ref 78.0–100.0)
MONO ABS: 0.6 10*3/uL (ref 0.1–1.0)
Monocytes Relative: 8 %
NEUTROS ABS: 4.4 10*3/uL (ref 1.7–7.7)
Neutrophils Relative %: 56 %
PLATELETS: 288 10*3/uL (ref 150–400)
RBC: 4.24 MIL/uL (ref 3.87–5.11)
RDW: 13.4 % (ref 11.5–15.5)
WBC: 7.8 10*3/uL (ref 4.0–10.5)

## 2018-02-09 LAB — COMPREHENSIVE METABOLIC PANEL
ALBUMIN: 4.3 g/dL (ref 3.5–5.0)
ALT: 17 U/L (ref 14–54)
AST: 20 U/L (ref 15–41)
Alkaline Phosphatase: 66 U/L (ref 38–126)
Anion gap: 7 (ref 5–15)
BUN: 11 mg/dL (ref 6–20)
CO2: 25 mmol/L (ref 22–32)
CREATININE: 0.66 mg/dL (ref 0.44–1.00)
Calcium: 9.5 mg/dL (ref 8.9–10.3)
Chloride: 107 mmol/L (ref 101–111)
GFR calc Af Amer: >60 mL/min (ref >60)
GFR calc non Af Amer: >60 mL/min (ref >60)
GLUCOSE: 94 mg/dL (ref 65–99)
POTASSIUM: 4 mmol/L (ref 3.5–5.1)
Sodium: 139 mmol/L (ref 135–145)
Total Bilirubin: 0.4 mg/dL (ref 0.3–1.2)
Total Protein: 8.7 g/dL — ABNORMAL HIGH (ref 6.5–8.1)

## 2018-02-09 LAB — URINALYSIS, ROUTINE W REFLEX MICROSCOPIC
BACTERIA UA: NONE SEEN
BILIRUBIN URINE: NEGATIVE
Glucose, UA: NEGATIVE mg/dL
HGB URINE DIPSTICK: NEGATIVE
Ketones, ur: NEGATIVE mg/dL
Leukocytes, UA: NEGATIVE
NITRITE: NEGATIVE
PROTEIN: 30 mg/dL — AB
SPECIFIC GRAVITY, URINE: 1.026 (ref 1.005–1.030)
pH: 6 (ref 5.0–8.0)

## 2018-02-09 LAB — PREGNANCY, URINE: PREG TEST UR: NEGATIVE

## 2018-02-09 MED ORDER — AMLODIPINE BESYLATE 5 MG PO TABS: 5.0000 mg | ORAL_TABLET | Freq: Every day | ORAL | 0 refills | Status: DC

## 2018-02-09 MED ORDER — METHOCARBAMOL 500 MG PO TABS: 500.0000 mg | ORAL_TABLET | Freq: Two times a day (BID) | ORAL | 0 refills | Status: DC | PRN

## 2018-02-09 MED ORDER — HYDROCHLOROTHIAZIDE 25 MG PO TABS: 25.0000 mg | ORAL_TABLET | Freq: Every day | ORAL | 0 refills | Status: DC

## 2018-02-09 MED ORDER — SODIUM CHLORIDE 0.9 % IV BOLUS
1000.0000 mL | Freq: Once | INTRAVENOUS | Status: AC
  Administered 2018-02-09: 1000 mL via INTRAVENOUS

## 2018-02-09 MED ORDER — METHOCARBAMOL 500 MG PO TABS
500.0000 mg | ORAL_TABLET | Freq: Once | ORAL | Status: AC
  Administered 2018-02-09: 500 mg via ORAL
  Filled 2018-02-09: qty 1

## 2018-02-09 MED ORDER — AMLODIPINE BESYLATE 5 MG PO TABS
5.0000 mg | ORAL_TABLET | Freq: Once | ORAL | Status: AC
  Administered 2018-02-09: 5 mg via ORAL
  Filled 2018-02-09: qty 1

## 2018-02-09 NOTE — ED Triage Notes (Signed)
Pt complains of weakness, painful urination for the past week. Pt states she has 9/10 lower back pain.

## 2018-02-09 NOTE — Discharge Instructions (Addendum)
It was my pleasure taking care of you today!   We have sent your urine for a culture. If it appears to be infected, someone will call you so that you can get antibiotics.   Robaxin is your muscle relaxer to take twice daily as needed.   I have refilled your blood pressure medication. It is very important to call a primary care doctor as soon as possible to schedule an outpatient follow up appointment for blood pressure recheck. Take these medications as directed.   Return to ER for severe headaches, vision changes, abdominal pain, vomiting, fevers, new or worsening symptoms, any additional concerns.

## 2018-02-09 NOTE — ED Provider Notes (Signed)
[facility]-EMERGENCY DEPT Provider Note   CSN: [id number] Arrival date & time: 02/09/18  1137     History   Chief Complaint Chief Complaint  Patient presents with  . Weakness  . Dysuria    HPI [name] is a 42 y.o. female.  The history is provided by the patient and medical records. No language interpreter was used.  Weakness  Primary symptoms include no dizziness. Pertinent negatives include no headaches.  Dysuria   Pertinent negatives include no frequency and no urgency.   [name] is a 42 y.o. female  with a PMH of HTN with medication non-compliance who presents to the Emergency Department complaining of generalized weakness and dysuria at the end of her stream for the last week.  Over the last 2 to 3 days, she has noticed aching bilateral low back pain as well.  She denies any fever or chills.  No chest pain, shortness of breath, abdominal pain.  She denies vaginal discharge or recent unprotected intercourse.  She notes there is no way this is due to STDs. Patient denies upper back or neck pain. No fever, saddle anesthesia, weakness, numbness, bowel/bladder retention/incontinence. No history of cancer, IVDU, or recent spinal procedures. No medications taken prior to arrival for symptoms. She does have hx of HTN and should be on medications, however she has not been taking her BP meds for several months - unsure how long, but at least 4-5 months. No abdominal pain, headache, visual changes.  Past Medical History:  Diagnosis Date  . Hypertension     There are no active problems to display for this patient.   Past Surgical History:  Procedure Laterality Date  . TUBAL LIGATION       OB History    Gravida  7   Para  3   Term  3   Preterm      AB  4   Living  3     SAB  2   TAB  2   Ectopic      Multiple      Live Births               Home Medications    Prior to Admission medications   Medication Sig Start Date  End Date Taking? Authorizing Provider  amLODipine (NORVASC) 5 MG tablet Take 1 tablet (5 mg total) by mouth daily. 02/09/18   Reggie Bise, Chase Picket, PA-C  hydrochlorothiazide (HYDRODIURIL) 25 MG tablet Take 1 tablet (25 mg total) by mouth daily. 02/09/18   Less Woolsey, Chase Picket, PA-C  methocarbamol (ROBAXIN) 500 MG tablet Take 1 tablet (500 mg total) by mouth 2 (two) times daily as needed for muscle spasms. 02/09/18   Niema Carrara, Chase Picket, PA-C  naproxen (NAPROSYN) 500 MG tablet Take 1 tablet (500 mg total) by mouth 2 (two) times daily. Patient not taking: Reported on 06/18/2017 11/03/16   Audry Pili, PA-C  ondansetron (ZOFRAN) 4 MG tablet Take 1 tablet (4 mg total) by mouth every 6 (six) hours. Patient not taking: Reported on 06/18/2017 11/03/16   Audry Pili, PA-C    Family History Family History  Problem Relation Age of Onset  . Cancer Father        throat and stomach  . Breast cancer Paternal Grandmother     Social History Social History   Tobacco Use  . Smoking status: Current Some Day Smoker    Packs/day: 0.50  . Smokeless tobacco: Never Used  Substance Use Topics  .  Alcohol use: Yes    Comment: occ  . Drug use: Yes    Types: Marijuana     Allergies   Patient has no known allergies.   Review of Systems Review of Systems  Constitutional: Negative for fever.  Genitourinary: Positive for dysuria. Negative for difficulty urinating, frequency, pelvic pain, urgency and vaginal discharge.  Musculoskeletal: Positive for back pain. Negative for neck pain.  Neurological: Positive for weakness. Negative for dizziness, numbness and headaches.  All other systems reviewed and are negative.    Physical Exam Updated Vital Signs BP (!) 172/123 (BP Location: Right Arm)   Pulse 81   Temp 97.9 F (36.6 C) (Oral)   Resp 14   SpO2 100%   Physical Exam  Constitutional: She is oriented to person, place, and time. She appears well-developed and well-nourished. No distress.  HENT:  Head:  Normocephalic and atraumatic.  Cardiovascular: Normal rate, regular rhythm and normal heart sounds.  No murmur heard. Pulmonary/Chest: Effort normal and breath sounds normal. No respiratory distress.  Abdominal: Soft. She exhibits no distension.  No abdominal or CVA tenderness.  Musculoskeletal:  No midline C/T/L spine tenderness. Mild reproducible tenderness to the low back.   Neurological: She is alert and oriented to person, place, and time.  Skin: Skin is warm and dry.  Nursing note and vitals reviewed.    ED Treatments / Results  Labs (all labs ordered are listed, but only abnormal results are displayed) Labs Reviewed  URINALYSIS, ROUTINE W REFLEX MICROSCOPIC - Abnormal; Notable for the following components:      Result Value   Protein, ur 30 (*)    All other components within normal limits  COMPREHENSIVE METABOLIC PANEL - Abnormal; Notable for the following components:   Total Protein 8.7 (*)    All other components within normal limits  URINE CULTURE  PREGNANCY, URINE  CBC WITH DIFFERENTIAL/PLATELET    EKG None  Radiology No results found.  Procedures Procedures (including critical care time)  Medications Ordered in ED Medications  sodium chloride 0.9 % bolus 1,000 mL (0 mLs Intravenous Stopped 02/09/18 1713)  amLODipine (NORVASC) tablet 5 mg (5 mg Oral Given 02/09/18 1558)  methocarbamol (ROBAXIN) tablet 500 mg (500 mg Oral Given 02/09/18 1845)     Initial Impression / Assessment and Plan / ED Course  I have reviewed the triage vital signs and the nursing notes.  Pertinent labs & imaging results that were available during my care of the patient were reviewed by me and considered in my medical decision making (see chart for details).    [name] is a 42 y.o. female who presents to ED for dysuria for 1 week with low back pain over the last several days.  On exam, patient is afebrile with benign abdominal exam and no CVA to previous.  No midline spinal  tenderness.  UA with 30 protein, but negative for infection.  Sent for culture.  Discussed possibility this could be vaginal infection.  She denies any recent intercourse or concerns for STDs.  Recommended pelvic exam, but she declines at this time.  Labs reviewed and reassuring.  Normal creatinine and white count.  BP elevated in emergency department today.  She has history of hypertension with medication noncompliance and has been off of both of her meds for several months now.  She has a primary care provider following this.  She was given dose of BP med in ER with downward trend in blood pressure.  Strongly encouraged her to  follow up and referral information given.  Home blood pressure medications prescribed. Evaluation does not show pathology that would require ongoing emergent intervention or inpatient treatment.  Reasons to return to ER discussed and all questions answered.  Patient discussed with Dr. Clayborne Dana who agrees with treatment plan.    Final Clinical Impressions(s) / ED Diagnoses   Final diagnoses:  Weakness  Dysuria  Essential hypertension    ED Discharge Orders        Ordered    amLODipine (NORVASC) 5 MG tablet  Daily     02/09/18 1824    hydrochlorothiazide (HYDRODIURIL) 25 MG tablet  Daily     02/09/18 1824    methocarbamol (ROBAXIN) 500 MG tablet  2 times daily PRN     02/09/18 1827       Elberta Lachapelle, Chase Picket, PA-C 02/09/18 1929    Mesner, Barbara Cower, MD 02/10/18 [phone]

## 2018-02-09 NOTE — ED Notes (Signed)
2x unsuccessful IV attempt-R AC, R hand.

## 2018-02-11 LAB — URINE CULTURE: Culture: <10000 — AB

## 2018-04-20 ENCOUNTER — Emergency Department (HOSPITAL_COMMUNITY): Payer: Self-pay

## 2018-04-20 ENCOUNTER — Emergency Department (HOSPITAL_COMMUNITY)
Admission: EM | Admit: 2018-04-20 | Discharge: 2018-04-20 | Disposition: A | Discharge status: Home / Self Care | Payer: Self-pay | Attending: Emergency Medicine | Admitting: Emergency Medicine

## 2018-04-20 ENCOUNTER — Encounter (HOSPITAL_COMMUNITY): Payer: Self-pay | Admitting: *Deleted

## 2018-04-20 DIAGNOSIS — I10 Essential (primary) hypertension: Secondary | ICD-10-CM | POA: Insufficient documentation

## 2018-04-20 DIAGNOSIS — F17210 Nicotine dependence, cigarettes, uncomplicated: Secondary | ICD-10-CM | POA: Insufficient documentation

## 2018-04-20 DIAGNOSIS — L03211 Cellulitis of face: Secondary | ICD-10-CM | POA: Insufficient documentation

## 2018-04-20 DIAGNOSIS — Z79899 Other long term (current) drug therapy: Secondary | ICD-10-CM | POA: Insufficient documentation

## 2018-04-20 MED ORDER — IBUPROFEN 800 MG PO TABS: 800.0000 mg | ORAL_TABLET | Freq: Three times a day (TID) | ORAL | 0 refills | Status: AC

## 2018-04-20 MED ORDER — OXYCODONE-ACETAMINOPHEN 5-325 MG PO TABS
1.0000 | ORAL_TABLET | Freq: Once | ORAL | Status: AC
  Administered 2018-04-20: 1 via ORAL
  Filled 2018-04-20: qty 1

## 2018-04-20 MED ORDER — HYDROCHLOROTHIAZIDE 25 MG PO TABS: 25.0000 mg | ORAL_TABLET | Freq: Every day | ORAL | 0 refills | Status: DC

## 2018-04-20 MED ORDER — CLINDAMYCIN HCL 300 MG PO CAPS: 300.0000 mg | ORAL_CAPSULE | Freq: Four times a day (QID) | ORAL | 0 refills | Status: AC

## 2018-04-20 MED ORDER — AMLODIPINE BESYLATE 5 MG PO TABS: 5.0000 mg | ORAL_TABLET | Freq: Every day | ORAL | 0 refills | Status: DC

## 2018-04-20 MED ORDER — ONDANSETRON 4 MG PO TBDP
4.0000 mg | ORAL_TABLET | Freq: Once | ORAL | Status: AC
  Administered 2018-04-20: 4 mg via ORAL
  Filled 2018-04-20: qty 1

## 2018-04-20 NOTE — ED Notes (Signed)
Pt has not had her BP medications for 2 days

## 2018-04-20 NOTE — Discharge Instructions (Signed)
Take the full course of the antibiotics, Clindamycin, which I prescribed for you today.  I have listed the information for our on-call dentist, Dr. Leanord AsalFarless, and provided you with a list of other dentists that you may follow-up with you. You can use either of these or another one of your choosing.   You may use this ibuprofen with Tylenol to assist with pain relief and to help the swelling go down. You may also apply cold compresses to your cheek for 10-15 minutes at a time as well.  Your blood pressure was elevated today. I have also refilled your blood pressure medications. Be sure to follow-up with a PCP or at [facility] for refills and to discuss other common medical issues.

## 2018-04-20 NOTE — ED Triage Notes (Signed)
Pt c/o left sided facial swelling and dental pain (pt unsure which tooth, sts it might be her gums) since Monday. Pt sts she took 2 antibiotics from family member but pain is just getting worse.

## 2018-04-20 NOTE — ED Provider Notes (Signed)
[facility]-EMERGENCY DEPT Provider Note  CSN: [id number] Arrival date & time: 04/20/18  1459  History   Chief Complaint Chief Complaint  Patient presents with  . Facial Swelling  . Dental Pain    HPI [name] is a 42 y.o. female with a medical history of HTN who presented to the ED for left side facial pain and swelling x1 day. Patient reports that she went to the dentist last year for a left lower tooth pain and was advised to get it removed, but did not follow through with that. She states the pain subsided and she never had other dental follow-up due to change in insurance. Patient states she woke up yesterday 04/19/18 with the facial swelling pain. Associated symptoms: congestion, left otalgia, warmth on her cheek and trismus. Patient has tried 1 dose of an old antibiotic from her sister prior to coming to the ED.  Past Medical History:  Diagnosis Date  . Hypertension     There are no active problems to display for this patient.   Past Surgical History:  Procedure Laterality Date  . TUBAL LIGATION       OB History    Gravida  7   Para  3   Term  3   Preterm      AB  4   Living  3     SAB  2   TAB  2   Ectopic      Multiple      Live Births               Home Medications    Prior to Admission medications   Medication Sig Start Date End Date Taking? Authorizing Provider  amLODipine (NORVASC) 5 MG tablet Take 1 tablet (5 mg total) by mouth daily. 04/20/18   Jesus Poplin, Jerrel Ivory I, PA-C  clindamycin (CLEOCIN) 300 MG capsule Take 1 capsule (300 mg total) by mouth 4 (four) times daily for 7 days. 04/20/18 04/27/18  Shermon Bozzi, Jerrel Ivory I, PA-C  hydrochlorothiazide (HYDRODIURIL) 25 MG tablet Take 1 tablet (25 mg total) by mouth daily. 04/20/18   Alper Guilmette, Jerrel Ivory I, PA-C  ibuprofen (ADVIL,MOTRIN) 800 MG tablet Take 1 tablet (800 mg total) by mouth 3 (three) times daily for 10 days. 04/20/18 04/30/18  Kobey Sides, Sharyon Medicus, PA-C    Family  History Family History  Problem Relation Age of Onset  . Cancer Father        throat and stomach  . Breast cancer Paternal Grandmother     Social History Social History   Tobacco Use  . Smoking status: Current Some Day Smoker    Packs/day: 0.50  . Smokeless tobacco: Never Used  Substance Use Topics  . Alcohol use: Yes    Comment: occ  . Drug use: Yes    Types: Marijuana     Allergies   Patient has no known allergies.   Review of Systems Review of Systems  Constitutional: Negative for activity change, appetite change, chills and fever.  HENT: Positive for congestion, dental problem, ear pain, facial swelling and sinus pressure. Negative for drooling, hearing loss, nosebleeds, postnasal drip, rhinorrhea, sinus pain, sore throat, tinnitus and trouble swallowing.   Eyes: Negative for pain and visual disturbance.  Respiratory: Negative for cough, choking, shortness of breath and stridor.   Cardiovascular: Negative.   Skin: Negative.      Physical Exam Updated Vital Signs BP (!) 185/122 (BP Location: Right Arm)   Pulse 76   Temp 98.3  F (36.8 C) (Oral)   Resp 16   Ht 5\' 7"  (1.702 m)   Wt 69.9 kg (154 lb)   LMP 04/15/2018   SpO2 100%   BMI 24.12 kg/m   Physical Exam  Constitutional: She appears well-developed and well-nourished.  Patient holding the left side of her face in pain.  HENT:  Head: Normocephalic and atraumatic. Head is without right periorbital erythema and without left periorbital erythema.    Right Ear: Tympanic membrane, external ear and ear canal normal.  Left Ear: Tympanic membrane, external ear and ear canal normal.  Nose: Nose normal. Right sinus exhibits no maxillary sinus tenderness and no frontal sinus tenderness. Left sinus exhibits no maxillary sinus tenderness and no frontal sinus tenderness.  Mouth/Throat: Uvula is midline, oropharynx is clear and moist and mucous membranes are normal. There is trismus in the jaw. Abnormal dentition.  Dental caries present. No dental abscesses or uvula swelling. No tonsillar exudate.    Significant swelling of left cheek that is warm and tender to palpation. No erythema or areas of fluctuance or induration.  Patient has several missing molars. Tooth #20 is chipped with visible erythema and root exposure.  Eyes: Pupils are equal, round, and reactive to light. Conjunctivae, EOM and lids are normal.  Neck: Normal range of motion and full passive range of motion without pain. Neck supple. Normal range of motion present.  Nursing note and vitals reviewed.    ED Treatments / Results  Labs (all labs ordered are listed, but only abnormal results are displayed) Labs Reviewed - No data to display  EKG None  Radiology Ct Maxillofacial Wo Contrast  Result Date: 04/20/2018 CLINICAL DATA:  Initial evaluation for left-sided facial swelling. EXAM: CT MAXILLOFACIAL WITHOUT CONTRAST TECHNIQUE: Multidetector CT imaging of the maxillofacial structures was performed. Multiplanar CT image reconstructions were also generated. COMPARISON:  None. FINDINGS: Osseous: No acute osseous abnormality about the face. Orbits: Negative. Sinuses: Mild scattered mucosal thickening within the ethmoidal air cells, maxillary sinuses, and sphenoid sinuses. No air-fluid level to suggest acute sinusitis. Mastoid air cells and middle ear cavities are clear. Soft tissues: Soft tissue swelling with inflammatory stranding throughout the left masticator space, consistent with cellulitis. Extension into the left submandibular and parapharyngeal spaces. Asymmetric thickening of the left platysmas. Odontogenic source is suspected, with prominent dental carie and periapical lucency at the left mandibular second bicuspid (series 3, image 56). No obvious collection, although evaluation limited by lack of IV contrast. Limited intracranial: Unremarkable. IMPRESSION: Findings consistent with acute left facial cellulitis. Odontogenic source is  suspected, with periapical lucency and dental carie at the left mandibular second bicuspid. No obvious abscess or drainable fluid collection on this noncontrast examination. Electronically Signed   By: Rise Mu M.D.   On: 04/20/2018 16:42    Procedures Procedures (including critical care time)  Medications Ordered in ED Medications  oxyCODONE-acetaminophen (PERCOCET/ROXICET) 5-325 MG per tablet 1 tablet (1 tablet Oral Given 04/20/18 1639)  ondansetron (ZOFRAN-ODT) disintegrating tablet 4 mg (4 mg Oral Given 04/20/18 1639)     Initial Impression / Assessment and Plan / ED Course  Triage vital signs and the nursing notes have been reviewed.  Pertinent labs & imaging results that were available during care of the patient were reviewed and considered in medical decision making (see chart for details).  While patient presents in pain which are the greatest contributors to her elevated BP upon arrival to the ED, patient is well appearing. She is afebrile and HR/RR remain normal.  Given the severity of swelling, congestion  and trismus seen on physical exam, there was concern for dental abscess that had sinus, orbit and/or osteo involvement.  Clinical Course as of Apr 20 1746  Wed Apr 20, 2018  1710 Left facial cellulitis seen on CT scan. No involvement of orbits or bone. No evidence of preseptal cellulitis. No abscess seen on this study.    [GM]    Clinical Course User Index [GM] Windy CarinaMortis, Matison Nuccio I, [place] JerseyPA-C   Patient is not in any acute respiratory distress. She has adequate ROM of her mouth with no neck stiffness or decreased ROM. She is afebrile. Pain relief achieved in the ED and patient is able to have more ROM in the jaw.   BP remains elevated. Documented history of HTN, but has not been compliant with medications due to insurance issues. She states that she recently began a new job where she has insurance again. No s/s of end organ damage that warrants further evaluation  today.  Final Clinical Impressions(s) / ED Diagnoses  1. Facial Cellulitis. Clindamycin 300mg  QID x7 days for infection and give adequate coverage for dental abscess. Advised to follow-up with dentist ASAP. Ibuprofen prescribed for inflammation and pain. Education provided on OTC and supportive treatments for inflammation and pain. 2. Hypertension. Refill home antihypertensives Norvasc 5mg  QD and HCTZ 25mg  QD. Advised to re-establish care with PCP and follow-up for future med refills and to discuss BP management.  Dispo: Home. After thorough clinical evaluation, this patient is determined to be medically stable and can be safely discharged with the previously mentioned treatment and/or outpatient follow-up/referral(s). At this time, there are no other apparent medical conditions that require further screening, evaluation or treatment.  Final diagnoses:  Facial cellulitis  Essential hypertension    ED Discharge Orders        Ordered    clindamycin (CLEOCIN) 300 MG capsule  4 times daily     04/20/18 1735    ibuprofen (ADVIL,MOTRIN) 800 MG tablet  3 times daily     04/20/18 1737    amLODipine (NORVASC) 5 MG tablet  Daily     04/20/18 1745    hydrochlorothiazide (HYDRODIURIL) 25 MG tablet  Daily     04/20/18 1745        Ferdinand Revoir, BodeGabrielle I, PA-C 04/20/18 1747    Jacalyn LefevreHaviland, Julie, MD 04/20/18 1807

## 2018-08-03 ENCOUNTER — Other Ambulatory Visit: Payer: Self-pay

## 2018-08-03 ENCOUNTER — Emergency Department (HOSPITAL_COMMUNITY)
Admission: EM | Admit: 2018-08-03 | Discharge: 2018-08-03 | Disposition: A | Discharge status: Home / Self Care | Payer: Self-pay | Attending: Emergency Medicine | Admitting: Emergency Medicine

## 2018-08-03 ENCOUNTER — Emergency Department (HOSPITAL_COMMUNITY): Payer: Self-pay

## 2018-08-03 DIAGNOSIS — Z23 Encounter for immunization: Secondary | ICD-10-CM | POA: Insufficient documentation

## 2018-08-03 DIAGNOSIS — I10 Essential (primary) hypertension: Secondary | ICD-10-CM | POA: Insufficient documentation

## 2018-08-03 DIAGNOSIS — Z79899 Other long term (current) drug therapy: Secondary | ICD-10-CM | POA: Insufficient documentation

## 2018-08-03 DIAGNOSIS — Y9389 Activity, other specified: Secondary | ICD-10-CM | POA: Insufficient documentation

## 2018-08-03 DIAGNOSIS — Y92009 Unspecified place in unspecified non-institutional (private) residence as the place of occurrence of the external cause: Secondary | ICD-10-CM | POA: Insufficient documentation

## 2018-08-03 DIAGNOSIS — S21012A Laceration without foreign body of left breast, initial encounter: Secondary | ICD-10-CM | POA: Insufficient documentation

## 2018-08-03 DIAGNOSIS — F17200 Nicotine dependence, unspecified, uncomplicated: Secondary | ICD-10-CM | POA: Insufficient documentation

## 2018-08-03 DIAGNOSIS — W540XXA Bitten by dog, initial encounter: Secondary | ICD-10-CM | POA: Insufficient documentation

## 2018-08-03 DIAGNOSIS — Y999 Unspecified external cause status: Secondary | ICD-10-CM | POA: Insufficient documentation

## 2018-08-03 LAB — I-STAT BETA HCG BLOOD, ED (MC, WL, AP ONLY): I-stat hCG, quantitative: <5 m[IU]/mL (ref <5)

## 2018-08-03 MED ORDER — OXYCODONE-ACETAMINOPHEN 5-325 MG PO TABS: 1.0000 | ORAL_TABLET | Freq: Four times a day (QID) | ORAL | 0 refills | Status: DC | PRN

## 2018-08-03 MED ORDER — MORPHINE SULFATE (PF) 4 MG/ML IV SOLN
4.0000 mg | Freq: Once | INTRAVENOUS | Status: AC
  Administered 2018-08-03: 4 mg via INTRAVENOUS
  Filled 2018-08-03: qty 1

## 2018-08-03 MED ORDER — RABIES IMMUNE GLOBULIN 150 UNIT/ML IM INJ
20.0000 [IU]/kg | INJECTION | Freq: Once | INTRAMUSCULAR | Status: AC
  Administered 2018-08-03: 1425 [IU] via INTRAMUSCULAR
  Filled 2018-08-03: qty 9.5

## 2018-08-03 MED ORDER — AMOXICILLIN-POT CLAVULANATE 875-125 MG PO TABS: 1.0000 | ORAL_TABLET | Freq: Two times a day (BID) | ORAL | 0 refills | Status: DC

## 2018-08-03 MED ORDER — AMOXICILLIN-POT CLAVULANATE 875-125 MG PO TABS
1.0000 | ORAL_TABLET | Freq: Once | ORAL | Status: AC
  Administered 2018-08-03: 1 via ORAL
  Filled 2018-08-03: qty 1

## 2018-08-03 MED ORDER — IBUPROFEN 600 MG PO TABS: 600.0000 mg | ORAL_TABLET | Freq: Four times a day (QID) | ORAL | 0 refills | Status: DC | PRN

## 2018-08-03 MED ORDER — OXYCODONE-ACETAMINOPHEN 5-325 MG PO TABS: 2.0000 | ORAL_TABLET | Freq: Once | ORAL | Status: DC

## 2018-08-03 MED ORDER — RABIES VACCINE, PCEC IM SUSR
1.0000 mL | Freq: Once | INTRAMUSCULAR | Status: AC
  Administered 2018-08-03: 1 mL via INTRAMUSCULAR
  Filled 2018-08-03: qty 1

## 2018-08-03 MED ORDER — TETANUS-DIPHTH-ACELL PERTUSSIS 5-2.5-18.5 LF-MCG/0.5 IM SUSP
0.5000 mL | Freq: Once | INTRAMUSCULAR | Status: AC
  Administered 2018-08-03: 0.5 mL via INTRAMUSCULAR
  Filled 2018-08-03: qty 0.5

## 2018-08-03 MED ORDER — LIDOCAINE HCL (PF) 1 % IJ SOLN
30.0000 mL | Freq: Once | INTRAMUSCULAR | Status: AC
  Administered 2018-08-03: 30 mL
  Filled 2018-08-03: qty 30

## 2018-08-03 NOTE — ED Triage Notes (Signed)
Patient presents to ED via EMS with C/O a dog bite on her left side.  Bleeding controlled..  BP 180 palpated per EMS.

## 2018-08-03 NOTE — ED Provider Notes (Addendum)
[facility] EMERGENCY DEPARTMENT Provider Note   CSN: [id number] Arrival date & time: 08/03/18  1825     History   Chief Complaint Chief Complaint  Patient presents with  . Animal Bite    HPI [name] is a 42 y.o. female with history of hypertension who presents with dog bite to her left breast/axilla area.  The dog was riled up when a family member was playing with it and the patient was trying to get the dog back in its cage when the dog jumped up and bit her in the left breast.  She has significant pain to the area.  She denies any other chest pain or shortness of breath.  She has no difficulty moving her arm.  Bleeding is controlled.  Patient reports she did not take her blood pressure medication this morning.  Her tetanus is not up-to-date.  The dog's rabies is not up-to-date.  HPI  Past Medical History:  Diagnosis Date  . Hypertension     There are no active problems to display for this patient.   Past Surgical History:  Procedure Laterality Date  . TUBAL LIGATION       OB History    Gravida  7   Para  3   Term  3   Preterm      AB  4   Living  3     SAB  2   TAB  2   Ectopic      Multiple      Live Births               Home Medications    Prior to Admission medications   Medication Sig Start Date End Date Taking? Authorizing Provider  amLODipine (NORVASC) 5 MG tablet Take 1 tablet (5 mg total) by mouth daily. 04/20/18  Yes Mortis, Sharyon Medicus, PA-C  hydrochlorothiazide (HYDRODIURIL) 25 MG tablet Take 1 tablet (25 mg total) by mouth daily. 04/20/18  Yes Mortis, Jerrel Ivory I, PA-C  amoxicillin-clavulanate (AUGMENTIN) 875-125 MG tablet Take 1 tablet by mouth every 12 (twelve) hours. 08/03/18   Loella Hickle, Waylan Boga, PA-C  ibuprofen (ADVIL,MOTRIN) 600 MG tablet Take 1 tablet (600 mg total) by mouth every 6 (six) hours as needed. 08/03/18   Mariavictoria Nottingham, Waylan Boga, PA-C  oxyCODONE-acetaminophen (PERCOCET/ROXICET) 5-325 MG tablet  Take 1-2 tablets by mouth every 6 (six) hours as needed for severe pain. 08/03/18   Emi Holes, PA-C    Family History Family History  Problem Relation Age of Onset  . Cancer Father        throat and stomach  . Breast cancer Paternal Grandmother     Social History Social History   Tobacco Use  . Smoking status: Current Some Day Smoker    Packs/day: 0.50  . Smokeless tobacco: Never Used  Substance Use Topics  . Alcohol use: Yes    Comment: occ  . Drug use: Yes    Types: Marijuana     Allergies   Motrin [ibuprofen]   Review of Systems Review of Systems  Constitutional: Negative for chills and fever.  HENT: Negative for facial swelling and sore throat.   Respiratory: Negative for shortness of breath.   Cardiovascular: Negative for chest pain.  Gastrointestinal: Negative for abdominal pain, nausea and vomiting.  Genitourinary: Negative for dysuria.  Musculoskeletal: Negative for back pain.  Skin: Positive for wound. Negative for rash.  Neurological: Negative for headaches.  Psychiatric/Behavioral: The patient is not nervous/anxious.  Physical Exam Updated Vital Signs BP (!) 172/107   Pulse 86   Temp 98.9 F (37.2 C) (Oral)   Resp 20   Ht 5\' 7"  (1.702 m)   Wt 72.6 kg   LMP 07/13/2018   SpO2 100%   BMI 25.06 kg/m   Physical Exam  Constitutional: She appears well-developed and well-nourished. No distress.  HENT:  Head: Normocephalic and atraumatic.  Mouth/Throat: Oropharynx is clear and moist. No oropharyngeal exudate.  Eyes: Pupils are equal, round, and reactive to light. Conjunctivae are normal. Right eye exhibits no discharge. Left eye exhibits no discharge. No scleral icterus.  Neck: Normal range of motion. Neck supple. No thyromegaly present.  Cardiovascular: Normal rate, regular rhythm, normal heart sounds and intact distal pulses. Exam reveals no gallop and no friction rub.  No murmur heard. Pulmonary/Chest: Effort normal and breath sounds  normal. No stridor. No respiratory distress. She has no wheezes. She has no rales.  Full range of motion of the shoulder    Abdominal: Soft. Bowel sounds are normal. She exhibits no distension. There is no tenderness. There is no rebound and no guarding.  Musculoskeletal: She exhibits no edema.  Lymphadenopathy:    She has no cervical adenopathy.  Neurological: She is alert. Coordination normal.  Skin: Skin is warm and dry. No rash noted. She is not diaphoretic. No pallor.  Psychiatric: She has a normal mood and affect.  Nursing note and vitals reviewed.    ED Treatments / Results  Labs (all labs ordered are listed, but only abnormal results are displayed) Labs Reviewed  I-STAT BETA HCG BLOOD, ED ([facility], [facility], [facility] ONLY)    EKG None  Radiology Dg Ribs Unilateral W/chest Left  Result Date: 08/03/2018 CLINICAL DATA:  42 year old female status post dog bite on left anterolateral chest near axilla. EXAM: LEFT RIBS AND CHEST - 3+ VIEW COMPARISON:  Chest radiographs 06/18/2017. FINDINGS: Lung volumes and mediastinal contours remain normal. Both lungs appear clear. No pneumothorax or pleural effusion. Visualized tracheal air column is within normal limits. Negative visible bowel gas pattern. Posttraumatic soft tissue gas in the left lateral upper breast (arrow). Subjacent left ribs appear intact and normal. No radiopaque foreign body identified. Other visible osseous structures appear intact. IMPRESSION: 1. Left breast/chest wall soft tissue injury with soft tissue gas. No radiopaque foreign body or underlying rib abnormality identified. 2.  No cardiopulmonary abnormality. Electronically Signed   By: Odessa Fleming M.D.   On: 08/03/2018 19:58    Procedures .Marland KitchenLaceration Repair Date/Time: 08/03/2018 9:58 PM Performed by: Emi Holes, PA-C Authorized by: Emi Holes, PA-C   Consent:    Consent obtained:  Verbal   Consent given by:  Patient   Risks discussed:  Infection, pain and poor  cosmetic result   Alternatives discussed:  No treatment Anesthesia (see MAR for exact dosages):    Anesthesia method:  Local infiltration   Local anesthetic:  Lidocaine 1% w/o epi Laceration details:    Location:  Trunk   Trunk location:  L breast   Length (cm):  5 Repair type:    Repair type:  Simple Pre-procedure details:    Preparation:  Patient was prepped and draped in usual sterile fashion and imaging obtained to evaluate for foreign bodies Exploration:    Hemostasis achieved with:  Direct pressure   Wound exploration: wound explored through full range of motion and entire depth of wound probed and visualized     Wound extent: no foreign bodies/material noted and no muscle  damage noted     Contaminated: no   Treatment:    Area cleansed with:  Saline   Amount of cleaning:  Standard   Irrigation solution:  Sterile saline   Irrigation volume:    Irrigation method:  Syringe   Visualized foreign bodies/material removed: no   Skin repair:    Repair method:  Sutures   Suture size:  4-0   Wound skin closure material used: Ethilon.   Suture technique:  Simple interrupted   Number of sutures:  3 Approximation:    Approximation:  Loose Post-procedure details:    Dressing:  Antibiotic ointment and non-adherent dressing   Patient tolerance of procedure:  Tolerated well, no immediate complications  .Marland KitchenLaceration Repair Date/Time: 08/03/2018 10:07 PM Performed by: Emi Holes, PA-C Authorized by: Emi Holes, PA-C   Consent:    Consent obtained:  Verbal   Consent given by:  Patient   Risks discussed:  Infection, pain and poor cosmetic result   Alternatives discussed:  No treatment Anesthesia (see MAR for exact dosages):    Anesthesia method:  Local infiltration   Local anesthetic:  Lidocaine 1% w/o epi Laceration details:    Location:  Trunk   Trunk location:  L breast   Length (cm):  1 Repair type:    Repair type:  Simple Pre-procedure details:     Preparation:  Patient was prepped and draped in usual sterile fashion and imaging obtained to evaluate for foreign bodies Exploration:    Hemostasis achieved with:  Direct pressure   Wound exploration: wound explored through full range of motion and entire depth of wound probed and visualized     Wound extent: no foreign bodies/material noted and no muscle damage noted   Treatment:    Area cleansed with:  Saline   Amount of cleaning:  Standard   Irrigation solution:  Sterile saline   Irrigation volume:  25   Irrigation method:  Syringe   Visualized foreign bodies/material removed: no   Skin repair:    Repair method:  Sutures   Suture size:  4-0   Wound skin closure material used: Ethilon.   Suture technique:  Simple interrupted   Number of sutures:  1 Approximation:    Approximation:  Loose Post-procedure details:    Dressing:  Antibiotic ointment and non-adherent dressing   Patient tolerance of procedure:  Tolerated well, no immediate complications   (including critical care time)  Medications Ordered in ED Medications  rabies immune globulin (HYPERAB/KEDRAB) injection 1,425 Units (1,425 Units Intramuscular Given 08/03/18 2021)  rabies vaccine (RABAVERT) injection 1 mL (1 mL Intramuscular Given 08/03/18 2022)  Tdap (BOOSTRIX) injection 0.5 mL (0.5 mLs Intramuscular Given 08/03/18 2023)  amoxicillin-clavulanate (AUGMENTIN) 875-125 MG per tablet 1 tablet (1 tablet Oral Given 08/03/18 2019)  morphine 4 MG/ML injection 4 mg (4 mg Intravenous Given 08/03/18 1924)  lidocaine (PF) (XYLOCAINE) 1 % injection 30 mL (30 mLs Infiltration Given 08/03/18 2055)  morphine 4 MG/ML injection 4 mg (4 mg Intravenous Given 08/03/18 2057)     Initial Impression / Assessment and Plan / ED Course  I have reviewed the triage vital signs and the nursing notes.  Pertinent labs & imaging results that were available during my care of the patient were reviewed by me and considered in my medical decision  making (see chart for details).     Patient with large dog bite to left breast, axilla area.  Imaging obtained and found no foreign bodies, but posttraumatic soft  tissue gas.  Patient has no chest pain or shortness of breath.  Tetanus updated.  Rabies series initiated.  Wound irrigated copiously.  Repaired loosely as above.  First dose of Augmentin given in the ED.  Will discharge home with same, as well as ibuprofen and Percocet for pain control.  I reviewed the [place] narcotic database and found no discrepancies.  Strict return precautions given.  Wound care discussed for home.  Patient understands and agrees with plan.  Patient vitals stable throughout ED course and discharged in satisfactory condition.  Final Clinical Impressions(s) / ED Diagnoses   Final diagnoses:  Dog bite, initial encounter    ED Discharge Orders         Ordered    ibuprofen (ADVIL,MOTRIN) 600 MG tablet  Every 6 hours PRN,   Status:  Discontinued     08/03/18 2152    oxyCODONE-acetaminophen (PERCOCET/ROXICET) 5-325 MG tablet  Every 6 hours PRN,   Status:  Discontinued     08/03/18 2152    amoxicillin-clavulanate (AUGMENTIN) 875-125 MG tablet  Every 12 hours,   Status:  Discontinued     08/03/18 2152    amoxicillin-clavulanate (AUGMENTIN) 875-125 MG tablet  Every 12 hours     08/03/18 2200    ibuprofen (ADVIL,MOTRIN) 600 MG tablet  Every 6 hours PRN     08/03/18 2200    oxyCODONE-acetaminophen (PERCOCET/ROXICET) 5-325 MG tablet  Every 6 hours PRN     08/03/18 2200           Emi Holes, PA-C 08/03/18 2210    Tegeler, Canary Brim, MD 08/03/18 2210    Emi Holes, PA-C 08/03/18 2213    Tegeler, Canary Brim, MD 08/03/18 2217

## 2018-08-03 NOTE — Discharge Instructions (Addendum)
Medications: Augmentin, ibuprofen, Percocet  Treatment: Take Augmentin until completed.  Take ibuprofen every 6 hours for your pain.  For breakthrough pain, you can take 1-2 Percocet every 6 hours.  Do not drive or operate machinery while taking this medication.  Wash your wound with warm soapy water daily and change the dressing.  Follow-up: You will need subsequent rabies vaccinations.  You can go to the urgent care on the property for these.  See the attached letter for scheduled dates.  Please return to emergency department immediately if you develop any fevers, increasing pain, redness, swelling, drainage, red streaking from the wound.  You will need to return to an emergency department or urgent care for suture removal in 10 to 14 days.  Do not drink alcohol, drive, operate machinery or participate in any other potentially dangerous activities while taking opiate pain medication as it may make you sleepy. Do not take this medication with any other sedating medications, either prescription or over-the-counter. If you were prescribed Percocet or Vicodin, do not take these with acetaminophen (Tylenol) as it is already contained within these medications and overdose of Tylenol is dangerous.   This medication is an opiate (or narcotic) pain medication and can be habit forming.  Use it as little as possible to achieve adequate pain control.  Do not use or use it with extreme caution if you have a history of opiate abuse or dependence. This medication is intended for your use only - do not give any to anyone else and keep it in a secure place where nobody else, especially children, have access to it. It will also cause or worsen constipation, so you may want to consider taking an over-the-counter stool softener while you are taking this medication.

## 2018-08-08 ENCOUNTER — Encounter (HOSPITAL_COMMUNITY): Payer: Self-pay | Admitting: *Deleted

## 2018-08-08 ENCOUNTER — Emergency Department (HOSPITAL_COMMUNITY)
Admission: EM | Admit: 2018-08-08 | Discharge: 2018-08-08 | Disposition: A | Discharge status: Home / Self Care | Payer: Self-pay | Attending: Emergency Medicine | Admitting: Emergency Medicine

## 2018-08-08 DIAGNOSIS — E876 Hypokalemia: Secondary | ICD-10-CM | POA: Insufficient documentation

## 2018-08-08 DIAGNOSIS — R112 Nausea with vomiting, unspecified: Secondary | ICD-10-CM | POA: Insufficient documentation

## 2018-08-08 DIAGNOSIS — W540XXD Bitten by dog, subsequent encounter: Secondary | ICD-10-CM | POA: Insufficient documentation

## 2018-08-08 DIAGNOSIS — F17200 Nicotine dependence, unspecified, uncomplicated: Secondary | ICD-10-CM | POA: Insufficient documentation

## 2018-08-08 DIAGNOSIS — S21152D Open bite of left front wall of thorax without penetration into thoracic cavity, subsequent encounter: Secondary | ICD-10-CM | POA: Insufficient documentation

## 2018-08-08 DIAGNOSIS — Z23 Encounter for immunization: Secondary | ICD-10-CM | POA: Insufficient documentation

## 2018-08-08 DIAGNOSIS — R197 Diarrhea, unspecified: Secondary | ICD-10-CM | POA: Insufficient documentation

## 2018-08-08 DIAGNOSIS — Z79899 Other long term (current) drug therapy: Secondary | ICD-10-CM | POA: Insufficient documentation

## 2018-08-08 DIAGNOSIS — Z2914 Encounter for prophylactic rabies immune globin: Secondary | ICD-10-CM | POA: Insufficient documentation

## 2018-08-08 DIAGNOSIS — Z203 Contact with and (suspected) exposure to rabies: Secondary | ICD-10-CM | POA: Insufficient documentation

## 2018-08-08 DIAGNOSIS — I10 Essential (primary) hypertension: Secondary | ICD-10-CM | POA: Insufficient documentation

## 2018-08-08 LAB — CBC
HCT: 40 % (ref 36.0–46.0)
Hemoglobin: 12.7 g/dL (ref 12.0–15.0)
MCH: 31.1 pg (ref 26.0–34.0)
MCHC: 31.8 g/dL (ref 30.0–36.0)
MCV: 98 fL (ref 80.0–100.0)
NRBC: 0 % (ref 0.0–0.2)
PLATELETS: 308 10*3/uL (ref 150–400)
RBC: 4.08 MIL/uL (ref 3.87–5.11)
RDW: 13.1 % (ref 11.5–15.5)
WBC: 7.8 10*3/uL (ref 4.0–10.5)

## 2018-08-08 LAB — COMPREHENSIVE METABOLIC PANEL
ALK PHOS: 53 U/L (ref 38–126)
ALT: 22 U/L (ref 0–44)
AST: 22 U/L (ref 15–41)
Albumin: 4 g/dL (ref 3.5–5.0)
Anion gap: 9 (ref 5–15)
BUN: 14 mg/dL (ref 6–20)
CALCIUM: 9 mg/dL (ref 8.9–10.3)
CO2: 21 mmol/L — AB (ref 22–32)
CREATININE: 0.71 mg/dL (ref 0.44–1.00)
Chloride: 108 mmol/L (ref 98–111)
Glucose, Bld: 130 mg/dL — ABNORMAL HIGH (ref 70–99)
Potassium: 3.1 mmol/L — ABNORMAL LOW (ref 3.5–5.1)
Sodium: 138 mmol/L (ref 135–145)
Total Bilirubin: 0.7 mg/dL (ref 0.3–1.2)
Total Protein: 7.1 g/dL (ref 6.5–8.1)

## 2018-08-08 LAB — I-STAT BETA HCG BLOOD, ED (MC, WL, AP ONLY)

## 2018-08-08 LAB — URINALYSIS, ROUTINE W REFLEX MICROSCOPIC
BILIRUBIN URINE: NEGATIVE
Bacteria, UA: NONE SEEN
Glucose, UA: NEGATIVE mg/dL
Ketones, ur: 20 mg/dL — AB
LEUKOCYTES UA: NEGATIVE
Nitrite: NEGATIVE
PH: 6 (ref 5.0–8.0)
Protein, ur: NEGATIVE mg/dL
SPECIFIC GRAVITY, URINE: 1.021 (ref 1.005–1.030)

## 2018-08-08 MED ORDER — POTASSIUM CHLORIDE ER 10 MEQ PO TBCR: 30.0000 meq | EXTENDED_RELEASE_TABLET | Freq: Every day | ORAL | 0 refills | Status: DC

## 2018-08-08 MED ORDER — SODIUM CHLORIDE 0.9 % IV BOLUS
1000.0000 mL | Freq: Once | INTRAVENOUS | Status: AC
  Administered 2018-08-08: 1000 mL via INTRAVENOUS

## 2018-08-08 MED ORDER — RABIES VACCINE, PCEC IM SUSR
1.0000 mL | Freq: Once | INTRAMUSCULAR | Status: AC
  Administered 2018-08-08: 1 mL via INTRAMUSCULAR
  Filled 2018-08-08: qty 1

## 2018-08-08 MED ORDER — AMLODIPINE BESYLATE 5 MG PO TABS
5.0000 mg | ORAL_TABLET | Freq: Once | ORAL | Status: AC
  Administered 2018-08-08: 5 mg via ORAL
  Filled 2018-08-08: qty 1

## 2018-08-08 MED ORDER — FENTANYL CITRATE (PF) 100 MCG/2ML IJ SOLN
50.0000 ug | Freq: Once | INTRAMUSCULAR | Status: AC
  Administered 2018-08-08: 50 ug via INTRAVENOUS
  Filled 2018-08-08: qty 2

## 2018-08-08 MED ORDER — HYDROMORPHONE HCL 1 MG/ML IJ SOLN
1.0000 mg | Freq: Once | INTRAMUSCULAR | Status: AC
  Administered 2018-08-08: 1 mg via INTRAVENOUS
  Filled 2018-08-08: qty 1

## 2018-08-08 MED ORDER — OXYCODONE-ACETAMINOPHEN 5-325 MG PO TABS
1.0000 | ORAL_TABLET | Freq: Once | ORAL | Status: AC
  Administered 2018-08-08: 1 via ORAL
  Filled 2018-08-08: qty 1

## 2018-08-08 MED ORDER — OXYCODONE-ACETAMINOPHEN 5-325 MG PO TABS: 1.0000 | ORAL_TABLET | Freq: Three times a day (TID) | ORAL | 0 refills | Status: DC | PRN

## 2018-08-08 MED ORDER — ONDANSETRON 4 MG PO TBDP: 4.0000 mg | ORAL_TABLET | Freq: Three times a day (TID) | ORAL | 0 refills | Status: DC | PRN

## 2018-08-08 MED ORDER — ONDANSETRON HCL 4 MG/2ML IJ SOLN
4.0000 mg | Freq: Once | INTRAMUSCULAR | Status: AC
  Administered 2018-08-08: 4 mg via INTRAVENOUS
  Filled 2018-08-08: qty 2

## 2018-08-08 NOTE — Discharge Instructions (Signed)
Return to ED for worsening symptoms, pus draining from wound, chest pain, severe abdominal pain, vomiting or coughing up blood.

## 2018-08-08 NOTE — ED Triage Notes (Addendum)
To ED for eval of vomiting and diarrhea since yesterday. States she was seen in ED 10/23 for treatment after a dog bite. Pt was placed on abx. Was supposed to come back 10/26 for additional rabies shot but was unable to 'get out of the bed'. Pt appears weak. Wound on left chest/breast area appears to be clean and healing - no drainage noted.

## 2018-08-08 NOTE — ED Provider Notes (Signed)
[facility] EMERGENCY DEPARTMENT Provider Note   CSN: [id number] Arrival date & time: 08/08/18  0827     History   Chief Complaint Chief Complaint  Patient presents with  . Nausea  . Emesis    HPI [name] is a 42 y.o. female with a past medical history of hypertension who presents to ED for evaluation of continued pain at the site of a dog bite that she was seen and treated for an 10/23, nausea, several episodes of nonbloody, nonbilious emesis daily and diarrhea.  Patient was seen and evaluated on 10/23 after her child's father's dog bit her on the left side of the chest.  The area was copiously irrigated and loosely closed.  She was given a prescription for Percocet and Augmentin.  Since beginning to take these medications, she has had the above-mentioned symptoms.  She reports compliance with the Augmentin and states that Percocet is not helping with the pain.  She denies history of fever and no recent use of antipyretics.  She received her initial rabies vaccine on 10/23 but did not return on 10/26 to receive her subsequent dose.  She denies any purulent drainage from the site of the wound.  Did not take her antihypertensives this morning.  Denies any reinjuries, abdominal pain, chest pain, shortness of breath.  HPI  Past Medical History:  Diagnosis Date  . Hypertension     There are no active problems to display for this patient.   Past Surgical History:  Procedure Laterality Date  . TUBAL LIGATION       OB History    Gravida  7   Para  3   Term  3   Preterm      AB  4   Living  3     SAB  2   TAB  2   Ectopic      Multiple      Live Births               Home Medications    Prior to Admission medications   Medication Sig Start Date End Date Taking? Authorizing Provider  amLODipine (NORVASC) 5 MG tablet Take 1 tablet (5 mg total) by mouth daily. 04/20/18  Yes Mortis, Jerrel Ivory I, PA-C  amoxicillin-clavulanate  (AUGMENTIN) 875-125 MG tablet Take 1 tablet by mouth every 12 (twelve) hours. 08/03/18  Yes Law, Alexandra M, PA-C  hydrochlorothiazide (HYDRODIURIL) 25 MG tablet Take 1 tablet (25 mg total) by mouth daily. 04/20/18  Yes Mortis, Sharyon Medicus, PA-C  ibuprofen (ADVIL,MOTRIN) 600 MG tablet Take 1 tablet (600 mg total) by mouth every 6 (six) hours as needed. 08/03/18  Yes Law, Alexandra M, PA-C  ondansetron (ZOFRAN ODT) 4 MG disintegrating tablet Take 1 tablet (4 mg total) by mouth every 8 (eight) hours as needed for nausea or vomiting. 08/08/18   Juron Vorhees, PA-C  oxyCODONE-acetaminophen (PERCOCET/ROXICET) 5-325 MG tablet Take 1 tablet by mouth every 8 (eight) hours as needed for severe pain. 08/08/18   [name] Feltman, PA-C  potassium chloride (K-DUR) 10 MEQ tablet Take 3 tablets (30 mEq total) by mouth daily for 3 days. 08/08/18 08/11/18  Dietrich Pates, PA-C    Family History Family History  Problem Relation Age of Onset  . Cancer Father        throat and stomach  . Breast cancer Paternal Grandmother     Social History Social History   Tobacco Use  . Smoking status: Current Some Day Smoker  Packs/day: 0.50  . Smokeless tobacco: Never Used  Substance Use Topics  . Alcohol use: Yes    Comment: occ  . Drug use: Yes    Types: Marijuana     Allergies   Motrin [ibuprofen]   Review of Systems Review of Systems  Constitutional: Positive for chills. Negative for appetite change and fever.  HENT: Negative for ear pain, rhinorrhea, sneezing and sore throat.   Eyes: Negative for photophobia and visual disturbance.  Respiratory: Negative for cough, chest tightness, shortness of breath and wheezing.   Cardiovascular: Negative for chest pain and palpitations.  Gastrointestinal: Positive for diarrhea, nausea and vomiting. Negative for abdominal pain, blood in stool and constipation.  Genitourinary: Negative for dysuria, hematuria and urgency.  Musculoskeletal: Negative for myalgias.  Skin:  Positive for wound. Negative for rash.  Neurological: Negative for dizziness, weakness and light-headedness.     Physical Exam Updated Vital Signs BP (!) 163/97   Pulse 65   Temp 98.5 F (36.9 C) (Oral)   Resp 17   LMP 07/13/2018   SpO2 99%   Physical Exam  Constitutional: She appears well-developed and well-nourished. No distress.  Tearful.  HENT:  Head: Normocephalic and atraumatic.  Nose: Nose normal.  Eyes: Conjunctivae and EOM are normal. Right eye exhibits no discharge. Left eye exhibits no discharge. No scleral icterus.  Neck: Normal range of motion. Neck supple.  Cardiovascular: Normal rate, regular rhythm, normal heart sounds and intact distal pulses. Exam reveals no gallop and no friction rub.  No murmur heard. Pulmonary/Chest: Effort normal and breath sounds normal. No respiratory distress.  Abdominal: Soft. Bowel sounds are normal. She exhibits no distension. There is no tenderness. There is no guarding.  No abdominal tenderness to palpation.  Musculoskeletal: Normal range of motion. She exhibits no edema.  Neurological: She is alert. She exhibits normal muscle tone. Coordination normal.  Skin: Skin is warm and dry. No rash noted.  Well-healing wound noted of the left axilla.  No surrounding erythema, induration or warmth noted.  Psychiatric: She has a normal mood and affect.  Nursing note and vitals reviewed.      ED Treatments / Results  Labs (all labs ordered are listed, but only abnormal results are displayed) Labs Reviewed  COMPREHENSIVE METABOLIC PANEL - Abnormal; Notable for the following components:      Result Value   Potassium 3.1 (*)    CO2 21 (*)    Glucose, Bld 130 (*)    All other components within normal limits  URINALYSIS, ROUTINE W REFLEX MICROSCOPIC - Abnormal; Notable for the following components:   APPearance HAZY (*)    Hgb urine dipstick LARGE (*)    Ketones, ur 20 (*)    All other components within normal limits  CBC  I-STAT  BETA HCG BLOOD, ED ([facility], [facility], [facility] ONLY)    EKG None  Radiology No results found.  Procedures Procedures (including critical care time)  Medications Ordered in ED Medications  sodium chloride 0.9 % bolus 1,000 mL (0 mLs Intravenous Stopped 08/08/18 1216)  ondansetron (ZOFRAN) injection 4 mg (4 mg Intravenous Given 08/08/18 1042)  fentaNYL (SUBLIMAZE) injection 50 mcg (50 mcg Intravenous Given 08/08/18 1042)  rabies vaccine (RABAVERT) injection 1 mL (1 mL Intramuscular Given 08/08/18 1044)  HYDROmorphone (DILAUDID) injection 1 mg (1 mg Intravenous Given 08/08/18 1139)  amLODipine (NORVASC) tablet 5 mg (5 mg Oral Given 08/08/18 1252)     Initial Impression / Assessment and Plan / ED Course  I have reviewed  the triage vital signs and the nursing notes.  Pertinent labs & imaging results that were available during my care of the patient were reviewed by me and considered in my medical decision making (see chart for details).  Clinical Course as of Aug 08 1408  Mon Aug 08, 2018  1026 Asked pharmacist regarding patient's next rabies vaccine, whether to resume with 2nd dose or need to start all over. She will let me know.   [HK]  1131 Patient reports mild improvement in her symptoms with medications given.   [HK]  1239 Patient with continued improvement in her symptoms.  Will order her blood pressure medication to complete p.o. challenge.  Awaiting UA.   [HK]    Clinical Course User Index [HK] Dietrich Pates, PA-C    42 year old female with past medical history of hypertension presents to ED for continued pain at the site of a dog bite that she was seen and treated for on 10/23.  She has since developed nausea, several episodes of nonbloody, nonbilious emesis daily and diarrhea.  States that this began after she started taking the Augmentin that she was prescribed.  States that the Percocet that she was prescribed did not help with the pain.  She missed her second dose of her rabies  vaccine because she felt that she was too weak to get out of her house.  Denies any purulent drainage from the site of the wound, abdominal pain or fever.  She did not take her antihypertensives this morning.  Denies any reinjury.  On exam there is appears to be a well-healing wound at the site of the dog bite.  Sutures are intact.  No overlying skin changes, purulent drainage or induration noted. No abdominal TTP. She is afebrile with no recent use of antipyretics.  Lab work significant for mild hypokalemia 3.1, CBC, hCG unremarkable. Patient given pain medication, fluids and antiemetics with improvement in her symptoms.  She is given a dose of her amlodipine here, which improved her blood pressure.  Urinalysis is significant for some ketones.  Patient reports significant improvement in her symptoms with medications given here.  Dog bite appears like it is healing.  Will give patient prescription for p.o. potassium to take at home as well as antiemetics and pain medication.  Advised to return to ED for any severe worsening symptoms. [place] PMP queried with no discrepancies.  Patient is hemodynamically stable, in NAD, and able to ambulate in the ED. Evaluation does not show pathology that would require ongoing emergent intervention or inpatient treatment. I explained the diagnosis to the patient. Pain has been managed and has no complaints prior to discharge. Patient is comfortable with above plan and is stable for discharge at this time. All questions were answered prior to disposition. Strict return precautions for returning to the ED were discussed. Encouraged follow up with PCP.    Portions of this note were generated with Scientist, clinical (histocompatibility and immunogenetics). Dictation errors may occur despite best attempts at proofreading.   Final Clinical Impressions(s) / ED Diagnoses   Final diagnoses:  Nausea vomiting and diarrhea  Dog bite, subsequent encounter  Encounter for repeat administration of rabies vaccination    Hypokalemia    ED Discharge Orders         Ordered    ondansetron (ZOFRAN ODT) 4 MG disintegrating tablet  Every 8 hours PRN     08/08/18 1408    oxyCODONE-acetaminophen (PERCOCET/ROXICET) 5-325 MG tablet  Every 8 hours PRN     08/08/18  1408    potassium chloride (K-DUR) 10 MEQ tablet  Daily     08/08/18 1408           Dietrich Pates, PA-C 08/08/18 1526    Pricilla Loveless, MD 08/08/18 1555

## 2018-08-08 NOTE — ED Notes (Signed)
Pt verbalized understanding of discharge paperwork and follow-up care.  °

## 2020-01-24 ENCOUNTER — Emergency Department (HOSPITAL_COMMUNITY)
Admission: EM | Admit: 2020-01-24 | Discharge: 2020-01-24 | Disposition: A | Discharge status: Home / Self Care | Payer: Self-pay | Attending: Emergency Medicine | Admitting: Emergency Medicine

## 2020-01-24 ENCOUNTER — Other Ambulatory Visit: Payer: Self-pay

## 2020-01-24 ENCOUNTER — Encounter (HOSPITAL_COMMUNITY): Payer: Self-pay

## 2020-01-24 DIAGNOSIS — K0889 Other specified disorders of teeth and supporting structures: Secondary | ICD-10-CM

## 2020-01-24 DIAGNOSIS — F17210 Nicotine dependence, cigarettes, uncomplicated: Secondary | ICD-10-CM | POA: Insufficient documentation

## 2020-01-24 DIAGNOSIS — I10 Essential (primary) hypertension: Secondary | ICD-10-CM | POA: Insufficient documentation

## 2020-01-24 DIAGNOSIS — K047 Periapical abscess without sinus: Secondary | ICD-10-CM | POA: Insufficient documentation

## 2020-01-24 DIAGNOSIS — F1210 Cannabis abuse, uncomplicated: Secondary | ICD-10-CM | POA: Insufficient documentation

## 2020-01-24 DIAGNOSIS — Z79899 Other long term (current) drug therapy: Secondary | ICD-10-CM | POA: Insufficient documentation

## 2020-01-24 MED ORDER — AMOXICILLIN-POT CLAVULANATE 875-125 MG PO TABS
1.0000 | ORAL_TABLET | Freq: Once | ORAL | Status: AC
  Administered 2020-01-24: 1 via ORAL
  Filled 2020-01-24: qty 1

## 2020-01-24 MED ORDER — NAPROXEN 500 MG PO TABS: 500.0000 mg | ORAL_TABLET | Freq: Two times a day (BID) | ORAL | 0 refills | Status: DC

## 2020-01-24 MED ORDER — AMOXICILLIN-POT CLAVULANATE 875-125 MG PO TABS: 1.0000 | ORAL_TABLET | Freq: Two times a day (BID) | ORAL | 0 refills | Status: DC

## 2020-01-24 MED ORDER — HYDROCODONE-ACETAMINOPHEN 5-325 MG PO TABS
1.0000 | ORAL_TABLET | Freq: Once | ORAL | Status: AC
  Administered 2020-01-24: 1 via ORAL
  Filled 2020-01-24: qty 1

## 2020-01-24 MED ORDER — LIDOCAINE VISCOUS HCL 2 % MT SOLN: 15.0000 mL | OROMUCOSAL | 0 refills | Status: DC | PRN

## 2020-01-24 NOTE — Discharge Instructions (Addendum)
Take antibiotics as prescribed.  Take entire course, even if your symptoms improve. Take naproxen 2 times a day with meals.  Do not take other anti-inflammatories at the same time (Advil, Motrin, ibuprofen, Aleve). You may supplement with Tylenol if you need further pain control. Use viscous lidocaine to help with pain and swelling.  You may swish or swallow the medicine. Follow-up with a dentist at your scheduled appointment next week. Return to the emergency room if you develop high fevers, inability to open your mouth, swelling of your neck, or any new, worsening, or concerning symptoms.

## 2020-01-24 NOTE — ED Triage Notes (Signed)
Pt reports left lower dental pain and swelling since yesterday. Dentist appt next week but could not wait due to the pain.

## 2020-01-24 NOTE — ED Provider Notes (Signed)
[name]ERGENC[facility] Provider Note   CSN: [id number] Arrival date & time: 01/24/20  1009     History Chief Complaint  Patient presents with  . Dental Pain  . Facial Swelling    [name] is a 44 y.o. female presenting for evaluation.  Patient states she her left lower leg.  Yesterday she started to develop pain and swelling around the tooth and face.  Pain radiates down her entire left side of her body.  She is taking BC times with improving symptoms.  She has not tried anything else.  She has a history of previous issues with, but has not seen a dentist yet.  She has appointment for next week. She has been told he has HTN, but does not take medicine for this. No other medical problems.    HPI     Past Medical History:  Diagnosis Date  . Hypertension     There are no problems to display for this patient.   Past Surgical History:  Procedure Laterality Date  . TUBAL LIGATION       OB History    Gravida  7   Para  3   Term  3   Preterm      AB  4   Living  3     SAB  2   TAB  2   Ectopic      Multiple      Live Births              Family History  Problem Relation Age of Onset  . Cancer Father        throat and stomach  . Breast cancer Paternal Grandmother     Social History   Tobacco Use  . Smoking status: Current Some Day Smoker    Packs/day: 0.50  . Smokeless tobacco: Never Used  Substance Use Topics  . Alcohol use: Yes    Comment: occ  . Drug use: Yes    Types: Marijuana    Home Medications Prior to Admission medications   Medication Sig Start Date End Date Taking? Authorizing Provider  amLODipine (NORVASC) 5 MG tablet Take 1 tablet (5 mg total) by mouth daily. 04/20/18   Mortis, Alvie Heidelberg I, PA-C  amoxicillin-clavulanate (AUGMENTIN) 875-125 MG tablet Take 1 tablet by mouth every 12 (twelve) hours. 08/03/18   Law, Bea Graff, PA-C  hydrochlorothiazide (HYDRODIURIL) 25 MG tablet Take 1 tablet  (25 mg total) by mouth daily. 04/20/18   Mortis, Alvie Heidelberg I, PA-C  ibuprofen (ADVIL,MOTRIN) 600 MG tablet Take 1 tablet (600 mg total) by mouth every 6 (six) hours as needed. 08/03/18   Law, Bea Graff, PA-C  ondansetron (ZOFRAN ODT) 4 MG disintegrating tablet Take 1 tablet (4 mg total) by mouth every 8 (eight) hours as needed for nausea or vomiting. 08/08/18   Khatri, Hina, PA-C  oxyCODONE-acetaminophen (PERCOCET/ROXICET) 5-325 MG tablet Take 1 tablet by mouth every 8 (eight) hours as needed for severe pain. 08/08/18   Khatri, Hina, PA-C  potassium chloride (K-DUR) 10 MEQ tablet Take 3 tablets (30 mEq total) by mouth daily for 3 days. 08/08/18 08/11/18  Khatri, Nicanor Alcon, PA-C    Allergies    Motrin [ibuprofen]  Review of Systems   Review of Systems  Constitutional: Negative for fever.  HENT: Positive for dental problem and facial swelling.     Physical Exam Updated Vital Signs BP (!) 169/116   Pulse 94   Temp 98.5 F (36.9 C) (Oral)  Resp 16   Ht 5\' 7"  (1.702 m)   Wt 68 kg   LMP 01/24/2020   SpO2 99%   BMI 23.49 kg/m   Physical Exam Vitals and nursing note reviewed.  Constitutional:      General: She is not in acute distress.    Appearance: She is well-developed.  HENT:     Head: Normocephalic and atraumatic.     Mouth/Throat:      Comments: Overall poor dentition.  Multiple teeth missing.  Left lower tooth cracked with surrounding gum erythema and edema.  Tenderness to palpation of the remainder of the tooth.  Left-sided facial swelling overlying the left lower tooth.  Swelling does not extend to the neck.  No trismus.  Handling secretions easily.  No pain under the tongue. Cardiovascular:     Rate and Rhythm: Normal rate and regular rhythm.     Pulses: Normal pulses.  Pulmonary:     Effort: Pulmonary effort is normal.     Breath sounds: Normal breath sounds.  Abdominal:     General: There is no distension.  Musculoskeletal:        General: Normal range of motion.      Cervical back: Normal range of motion.  Skin:    General: Skin is warm.     Findings: No rash.  Neurological:     Mental Status: She is alert and oriented to person, place, and time.     ED Results / Procedures / Treatments   Labs (all labs ordered are listed, but only abnormal results are displayed) Labs Reviewed - No data to display  EKG None  Radiology No results found.  Procedures Procedures (including critical care time)  Medications Ordered in ED Medications - No data to display  ED Course  I have reviewed the triage vital signs and the nursing notes.  Pertinent labs & imaging results that were available during my care of the patient were reviewed by me and considered in my medical decision making (see chart for details).    MDM Rules/Calculators/A&P                      Patient presenting in for evaluation of dental pain and facial swelling.  On exam, patient has a cracked tooth with tenderness, edema, swelling.  Concern for infection.  No obvious abscess.  No concern for ludwig's.  Will treat with antibiotics, NSAIDs, and viscous Lidocaine.  Encourage patient follow-up with dentist as scheduled next week.  At this time, patient appears safe for discharge. Return precautions given. Pt states she understands and agrees to plan.   Final Clinical Impression(s) / ED Diagnoses Final diagnoses:  None    Rx / DC Orders ED Discharge Orders    None       01/26/2020, PA-C 01/24/20 1237    01/26/20, MD 01/26/20 1441

## 2020-01-24 NOTE — ED Notes (Signed)
Patient verbalizes understanding of discharge instructions. Opportunity for questioning and answers were provided. Armband removed by staff, pt discharged from ED.  

## 2020-03-19 ENCOUNTER — Ambulatory Visit (HOSPITAL_COMMUNITY)
Admission: EM | Admit: 2020-03-19 | Discharge: 2020-03-19 | Disposition: A | Discharge status: Home / Self Care | Payer: Self-pay | Attending: Emergency Medicine | Admitting: Emergency Medicine

## 2020-03-19 ENCOUNTER — Emergency Department (HOSPITAL_COMMUNITY)
Admission: EM | Admit: 2020-03-19 | Discharge: 2020-03-19 | Disposition: A | Discharge status: Home / Self Care | Payer: Self-pay | Attending: Emergency Medicine | Admitting: Emergency Medicine

## 2020-03-19 ENCOUNTER — Other Ambulatory Visit: Payer: Self-pay

## 2020-03-19 ENCOUNTER — Emergency Department (HOSPITAL_COMMUNITY): Payer: Self-pay

## 2020-03-19 ENCOUNTER — Encounter (HOSPITAL_COMMUNITY): Payer: Self-pay | Admitting: Emergency Medicine

## 2020-03-19 DIAGNOSIS — M545 Low back pain, unspecified: Secondary | ICD-10-CM

## 2020-03-19 DIAGNOSIS — I161 Hypertensive emergency: Secondary | ICD-10-CM | POA: Insufficient documentation

## 2020-03-19 DIAGNOSIS — I10 Essential (primary) hypertension: Secondary | ICD-10-CM | POA: Insufficient documentation

## 2020-03-19 DIAGNOSIS — F17210 Nicotine dependence, cigarettes, uncomplicated: Secondary | ICD-10-CM | POA: Insufficient documentation

## 2020-03-19 DIAGNOSIS — Z79899 Other long term (current) drug therapy: Secondary | ICD-10-CM | POA: Insufficient documentation

## 2020-03-19 LAB — BASIC METABOLIC PANEL
Anion gap: 10 (ref 5–15)
BUN: 8 mg/dL (ref 6–20)
CO2: 24 mmol/L (ref 22–32)
Calcium: 8.8 mg/dL — ABNORMAL LOW (ref 8.9–10.3)
Chloride: 102 mmol/L (ref 98–111)
Creatinine, Ser: 0.74 mg/dL (ref 0.44–1.00)
GFR calc Af Amer: >60 mL/min (ref >60)
GFR calc non Af Amer: >60 mL/min (ref >60)
Glucose, Bld: 106 mg/dL — ABNORMAL HIGH (ref 70–99)
Potassium: 3.4 mmol/L — ABNORMAL LOW (ref 3.5–5.1)
Sodium: 136 mmol/L (ref 135–145)

## 2020-03-19 LAB — URINALYSIS, ROUTINE W REFLEX MICROSCOPIC
Bacteria, UA: NONE SEEN
Bilirubin Urine: NEGATIVE
Glucose, UA: NEGATIVE mg/dL
Hgb urine dipstick: NEGATIVE
Ketones, ur: NEGATIVE mg/dL
Leukocytes,Ua: NEGATIVE
Nitrite: NEGATIVE
Protein, ur: 30 mg/dL — AB
Specific Gravity, Urine: 1.026 (ref 1.005–1.030)
pH: 6 (ref 5.0–8.0)

## 2020-03-19 LAB — CBC
HCT: 39.3 % (ref 36.0–46.0)
Hemoglobin: 12.7 g/dL (ref 12.0–15.0)
MCH: 32.1 pg (ref 26.0–34.0)
MCHC: 32.3 g/dL (ref 30.0–36.0)
MCV: 99.2 fL (ref 80.0–100.0)
Platelets: 226 10*3/uL (ref 150–400)
RBC: 3.96 MIL/uL (ref 3.87–5.11)
RDW: 13.1 % (ref 11.5–15.5)
WBC: 4.9 10*3/uL (ref 4.0–10.5)
nRBC: 0 % (ref 0.0–0.2)

## 2020-03-19 LAB — PREGNANCY, URINE: Preg Test, Ur: NEGATIVE

## 2020-03-19 LAB — POCT URINALYSIS DIP (DEVICE)
Glucose, UA: NEGATIVE mg/dL
Ketones, ur: NEGATIVE mg/dL
Leukocytes,Ua: NEGATIVE
Nitrite: NEGATIVE
Protein, ur: 100 mg/dL — AB
Specific Gravity, Urine: 1.025 (ref 1.005–1.030)
Urobilinogen, UA: 1 mg/dL (ref 0.0–1.0)
pH: 7 (ref 5.0–8.0)

## 2020-03-19 MED ORDER — CLONIDINE HCL 0.1 MG PO TABS
ORAL_TABLET | ORAL | Status: AC
  Filled 2020-03-19: qty 1

## 2020-03-19 MED ORDER — AMLODIPINE BESYLATE 5 MG PO TABS
5.0000 mg | ORAL_TABLET | Freq: Once | ORAL | Status: AC
  Administered 2020-03-19: 5 mg via ORAL
  Filled 2020-03-19: qty 1

## 2020-03-19 MED ORDER — HYDROCHLOROTHIAZIDE 25 MG PO TABS: 25.0000 mg | ORAL_TABLET | Freq: Every day | ORAL | 2 refills | Status: DC

## 2020-03-19 MED ORDER — OXYCODONE-ACETAMINOPHEN 5-325 MG PO TABS
2.0000 | ORAL_TABLET | Freq: Once | ORAL | Status: AC
  Administered 2020-03-19: 2 via ORAL
  Filled 2020-03-19: qty 2

## 2020-03-19 MED ORDER — HYDRALAZINE HCL 25 MG PO TABS
25.0000 mg | ORAL_TABLET | Freq: Once | ORAL | Status: AC
  Administered 2020-03-19: 25 mg via ORAL
  Filled 2020-03-19: qty 1

## 2020-03-19 MED ORDER — LISINOPRIL 10 MG PO TABS: 10.0000 mg | ORAL_TABLET | Freq: Every day | ORAL | 2 refills | Status: DC

## 2020-03-19 MED ORDER — HYDROMORPHONE HCL 1 MG/ML IJ SOLN
1.0000 mg | Freq: Once | INTRAMUSCULAR | Status: AC
  Administered 2020-03-19: 1 mg via INTRAVENOUS
  Filled 2020-03-19: qty 1

## 2020-03-19 MED ORDER — CLONIDINE HCL 0.1 MG PO TABS
0.1000 mg | ORAL_TABLET | Freq: Once | ORAL | Status: AC
  Administered 2020-03-19: 0.1 mg via ORAL

## 2020-03-19 MED ORDER — HYDROCODONE-ACETAMINOPHEN 5-325 MG PO TABS: 2.0000 | ORAL_TABLET | Freq: Four times a day (QID) | ORAL | 0 refills | Status: DC | PRN

## 2020-03-19 MED ORDER — ONDANSETRON HCL 4 MG/2ML IJ SOLN
4.0000 mg | Freq: Once | INTRAMUSCULAR | Status: AC
  Administered 2020-03-19: 4 mg via INTRAVENOUS
  Filled 2020-03-19: qty 2

## 2020-03-19 MED ORDER — ONDANSETRON 4 MG PO TBDP
4.0000 mg | ORAL_TABLET | Freq: Once | ORAL | Status: AC
  Administered 2020-03-19: 4 mg via ORAL
  Filled 2020-03-19: qty 1

## 2020-03-19 MED ORDER — HYDROCHLOROTHIAZIDE 25 MG PO TABS
25.0000 mg | ORAL_TABLET | Freq: Every day | ORAL | Status: DC
  Administered 2020-03-19: 25 mg via ORAL
  Filled 2020-03-19: qty 1

## 2020-03-19 NOTE — ED Provider Notes (Signed)
[facility]    CSN: [id number] Arrival date & time: 03/19/20  0809      History   Chief Complaint Chief Complaint  Patient presents with  . Back Pain    HPI [name] is a 44 y.o. female history of hypertension presenting today for evaluation of back pain.  Patient reports over the past 2 days she has had back pain across her lower back bilaterally.  Slightly worse on the right compared to left.  She denies any injury fall trauma or strain.  Denies any increase in activity heavy lifting or bending.  She has felt very tired and fatigued associated with this.  She thought this possibly could be a kidney infection and took a leftover antibiotic.  She denies any urinary symptoms of dysuria, increased frequency urgency or hematuria.  She has felt slightly nauseous.  Denies fevers.  Denies history of back problems.  Denies radiation into legs.  Denies numbness or tingling.  Denies saddle anesthesia.  Denies loss of control of bowel/bladder.   Has history of hypertension.  Has not been on medicines for approximately 1 year.  She does report occasional chest tightness and does report this at currently.  She also has had headaches and some lightheadedness.  Previously on hydrochlorothiazide.   HPI  Past Medical History:  Diagnosis Date  . Hypertension     There are no problems to display for this patient.   Past Surgical History:  Procedure Laterality Date  . TUBAL LIGATION      OB History    Gravida  7   Para  3   Term  3   Preterm      AB  4   Living  3     SAB  2   TAB  2   Ectopic      Multiple      Live Births               Home Medications    Prior to Admission medications   Medication Sig Start Date End Date Taking? Authorizing Provider  amLODipine (NORVASC) 5 MG tablet Take 1 tablet (5 mg total) by mouth daily. 04/20/18   Mortis, Alvie Heidelberg I, PA-C  amoxicillin-clavulanate (AUGMENTIN) 875-125 MG tablet Take 1 tablet by mouth every  12 (twelve) hours. 01/24/20   Caccavale, Sophia, PA-C  hydrochlorothiazide (HYDRODIURIL) 25 MG tablet Take 1 tablet (25 mg total) by mouth daily. 04/20/18   Mortis, Alvie Heidelberg I, PA-C  ibuprofen (ADVIL,MOTRIN) 600 MG tablet Take 1 tablet (600 mg total) by mouth every 6 (six) hours as needed. 08/03/18   Law, Bea Graff, PA-C  lidocaine (XYLOCAINE) 2 % solution Use as directed 15 mLs in the mouth or throat as needed for mouth pain. 01/24/20   Caccavale, Sophia, PA-C  naproxen (NAPROSYN) 500 MG tablet Take 1 tablet (500 mg total) by mouth 2 (two) times daily with a meal. 01/24/20   Caccavale, Sophia, PA-C  ondansetron (ZOFRAN ODT) 4 MG disintegrating tablet Take 1 tablet (4 mg total) by mouth every 8 (eight) hours as needed for nausea or vomiting. 08/08/18   Khatri, Hina, PA-C  oxyCODONE-acetaminophen (PERCOCET/ROXICET) 5-325 MG tablet Take 1 tablet by mouth every 8 (eight) hours as needed for severe pain. 08/08/18   Khatri, Hina, PA-C  potassium chloride (K-DUR) 10 MEQ tablet Take 3 tablets (30 mEq total) by mouth daily for 3 days. 08/08/18 08/11/18  Delia Heady, PA-C    Family History Family History  Problem Relation  Age of Onset  . Cancer Father        throat and stomach  . Breast cancer Paternal Grandmother     Social History Social History   Tobacco Use  . Smoking status: Current Some Day Smoker    Packs/day: 0.50  . Smokeless tobacco: Never Used  Substance Use Topics  . Alcohol use: Yes    Comment: occ  . Drug use: Yes    Types: Marijuana     Allergies   Motrin [ibuprofen]   Review of Systems Review of Systems  Constitutional: Negative for fatigue and fever.  HENT: Negative for congestion, sinus pressure and sore throat.   Eyes: Negative for photophobia, pain and visual disturbance.  Respiratory: Negative for cough and shortness of breath.   Cardiovascular: Negative for chest pain.  Gastrointestinal: Negative for abdominal pain, nausea and vomiting.  Genitourinary:  Negative for decreased urine volume, dysuria and hematuria.  Musculoskeletal: Positive for back pain and myalgias. Negative for neck pain and neck stiffness.  Neurological: Positive for headaches. Negative for dizziness, syncope, facial asymmetry, speech difficulty, weakness, light-headedness and numbness.     Physical Exam Triage Vital Signs ED Triage Vitals  Enc Vitals Group     BP 03/19/20 0827 (!) 188/156     Pulse Rate 03/19/20 0825 81     Resp 03/19/20 0825 16     Temp 03/19/20 0825 98.7 F (37.1 C)     Temp Source 03/19/20 0825 Oral     SpO2 03/19/20 0828 100 %     Weight --      Height --      Head Circumference --      Peak Flow --      Pain Score 03/19/20 0826 8     Pain Loc --      Pain Edu? --      Excl. in GC? --    No data found.  Updated Vital Signs BP (!) 191/127 (BP Location: Right Arm)   Pulse 81   Temp 98.7 F (37.1 C) (Oral)   Resp 16   LMP 02/24/2020 Comment: tubal ligation  SpO2 100%   Visual Acuity Right Eye Distance:   Left Eye Distance:   Bilateral Distance:    Right Eye Near:   Left Eye Near:    Bilateral Near:     Physical Exam Vitals and nursing note reviewed.  Constitutional:      Appearance: She is well-developed.     Comments: No acute distress  HENT:     Head: Normocephalic and atraumatic.     Nose: Nose normal.  Eyes:     Extraocular Movements: Extraocular movements intact.     Conjunctiva/sclera: Conjunctivae normal.     Pupils: Pupils are equal, round, and reactive to light.  Cardiovascular:     Rate and Rhythm: Normal rate.  Pulmonary:     Effort: Pulmonary effort is normal. No respiratory distress.     Comments: Breathing comfortably at rest, CTABL, no wheezing, rales or other adventitious sounds auscultated Abdominal:     General: There is no distension.  Musculoskeletal:        General: Normal range of motion.     Cervical back: Neck supple.     Comments: Back: Nontender palpation of cervical and thoracic spine  midline, and mild tenderness to palpation throughout lumbar spine midline, diffusely tender throughout bilateral lumbar paraspinal lateral musculature of lower back extending into superior gluteal areas  Hip strength 5/5 bilaterally, knee strength 5/5  bilaterally Patellar reflex 1+ bilaterally  Skin:    General: Skin is warm and dry.  Neurological:     Mental Status: She is alert and oriented to person, place, and time.      UC Treatments / Results  Labs (all labs ordered are listed, but only abnormal results are displayed) Labs Reviewed  POCT URINALYSIS DIP (DEVICE) - Abnormal; Notable for the following components:      Result Value   Bilirubin Urine SMALL (*)    Hgb urine dipstick TRACE (*)    Protein, ur 100 (*)    All other components within normal limits  URINE CULTURE    EKG   Radiology No results found.  Procedures Procedures (including critical care time)  Medications Ordered in UC Medications  cloNIDine (CATAPRES) tablet 0.1 mg (0.1 mg Oral Given 03/19/20 0902)    Initial Impression / Assessment and Plan / UC Course  I have reviewed the triage vital signs and the nursing notes.  Pertinent labs & imaging results that were available during my care of the patient were reviewed by me and considered in my medical decision making (see chart for details).     1.  Hypertensive urgency: Blood pressure significantly elevated in clinic, will provide clonidine 0.1 and recheck.  EKG normal sinus rhythm, no acute signs of ischemia or infarction.  UA with hemoglobin, bilirubin and protein, concerning for possible kidney damage.  Given this in association with blood pressure and chest tightness recommending further monitoring and management of hypertensive urgency/emergency and emergency room.  Patient verbalized understanding. Sent independently in private vehicle per patient request.   2.  Back pain: Suspect likely muscular etiology, pain reproducible to palpation, negative  leuks and nitrites, do not suspect UTI.  Discussed strict return precautions. Patient verbalized understanding and is agreeable with plan.  Final Clinical Impressions(s) / UC Diagnoses   Final diagnoses:  Hypertensive emergency  Acute bilateral low back pain without sciatica     Discharge Instructions     Please go to emergency room for further evaluation and management of blood pressure  -Hypertensive urgency/emergency- signs of kidney damage(hematuria and protein in urine), headache and chest tightness    ED Prescriptions    None     PDMP not reviewed this encounter.   Lew Dawes, [place] 03/19/20 [phone]

## 2020-03-19 NOTE — ED Triage Notes (Signed)
Hypertension in triage, she has been off of BP meds for over a year.

## 2020-03-19 NOTE — ED Provider Notes (Signed)
[facility] EMERGENCY DEPARTMENT Provider Note   CSN: [id number] Arrival date & time: 03/19/20  1004     History Chief Complaint  Patient presents with  . Flank Pain    [name] is a 44 y.o. female.  The history is provided by the patient. No language interpreter was used.  Flank Pain This is a new problem. The current episode started more than 2 days ago. The problem occurs constantly. The problem has been gradually worsening. Pertinent negatives include no chest pain. Nothing aggravates the symptoms. Nothing relieves the symptoms. She has tried nothing for the symptoms. The treatment provided moderate relief.  Pt reports she has pain in her low back.  Pt went to Urgent care and was sent here due to back pain, protein in urine and elevated blood pressure. Pt has a history of high blood pressure.  Pt was on medication but she states she stopped over a year ago.  Pt does not currently have an MD.      Past Medical History:  Diagnosis Date  . Hypertension     There are no problems to display for this patient.   Past Surgical History:  Procedure Laterality Date  . TUBAL LIGATION       OB History    Gravida  7   Para  3   Term  3   Preterm      AB  4   Living  3     SAB  2   TAB  2   Ectopic      Multiple      Live Births              Family History  Problem Relation Age of Onset  . Cancer Father        throat and stomach  . Breast cancer Paternal Grandmother     Social History   Tobacco Use  . Smoking status: Current Some Day Smoker    Packs/day: 0.50  . Smokeless tobacco: Never Used  Substance Use Topics  . Alcohol use: Yes    Comment: occ  . Drug use: Yes    Types: Marijuana    Home Medications Prior to Admission medications   Medication Sig Start Date End Date Taking? Authorizing Provider  amLODipine (NORVASC) 5 MG tablet Take 1 tablet (5 mg total) by mouth daily. Patient not taking: Reported on 03/19/2020  04/20/18 03/19/20  Mortis, Jerrel Ivory I, PA-C  hydrochlorothiazide (HYDRODIURIL) 25 MG tablet Take 1 tablet (25 mg total) by mouth daily. Patient not taking: Reported on 03/19/2020 04/20/18 03/19/20  Mortis, Jerrel Ivory I, PA-C  potassium chloride (K-DUR) 10 MEQ tablet Take 3 tablets (30 mEq total) by mouth daily for 3 days. 08/08/18 03/19/20  Khatri, Hillary Bow, PA-C    Allergies    Motrin [ibuprofen]  Review of Systems   Review of Systems  Cardiovascular: Negative for chest pain.  Genitourinary: Positive for flank pain.  All other systems reviewed and are negative.   Physical Exam Updated Vital Signs BP (!) 178/119 Comment: left arm   Pulse 66 Comment: left arm   Temp 98.1 F (36.7 C) (Oral)   Resp 20   Ht 5\' 7"  (1.702 m)   Wt 72.6 kg   LMP 02/24/2020 Comment: tubal ligation  SpO2 100% Comment: left arm   BMI 25.06 kg/m   Physical Exam Vitals and nursing note reviewed.  Constitutional:      Appearance: She is well-developed.  HENT:  Head: Normocephalic.     Mouth/Throat:     Mouth: Mucous membranes are moist.  Cardiovascular:     Rate and Rhythm: Normal rate.     Pulses: Normal pulses.  Pulmonary:     Effort: Pulmonary effort is normal.  Abdominal:     General: Abdomen is flat.     Tenderness: There is no abdominal tenderness. There is no guarding.  Musculoskeletal:        General: Normal range of motion.     Cervical back: Normal range of motion.  Skin:    General: Skin is warm.  Neurological:     General: No focal deficit present.     Mental Status: She is alert and oriented to person, place, and time.  Psychiatric:        Mood and Affect: Mood normal.     ED Results / Procedures / Treatments   Labs (all labs ordered are listed, but only abnormal results are displayed) Labs Reviewed  BASIC METABOLIC PANEL - Abnormal; Notable for the following components:      Result Value   Potassium 3.4 (*)    Glucose, Bld 106 (*)    Calcium 8.8 (*)    All other components  within normal limits  URINALYSIS, ROUTINE W REFLEX MICROSCOPIC - Abnormal; Notable for the following components:   APPearance HAZY (*)    Protein, ur 30 (*)    All other components within normal limits  CBC  PREGNANCY, URINE    EKG None  Radiology CT Renal Stone Study  Result Date: 03/19/2020 CLINICAL DATA:  Right flank pain x3 days. EXAM: CT ABDOMEN AND PELVIS WITHOUT CONTRAST TECHNIQUE: Multidetector CT imaging of the abdomen and pelvis was performed following the standard protocol without IV contrast. COMPARISON:  None. FINDINGS: Lower chest: No acute abnormality. Hepatobiliary: A 5 mm focus of parenchymal low attenuation is seen within the posterior aspect of the liver dome. No gallstones, gallbladder wall thickening, or biliary dilatation. Pancreas: Unremarkable. No pancreatic ductal dilatation or surrounding inflammatory changes. Spleen: Normal in size without focal abnormality. Adrenals/Urinary Tract: Adrenal glands are unremarkable. Kidneys are normal, without renal calculi, focal lesion, or hydronephrosis. Bladder is unremarkable. Stomach/Bowel: Stomach is within normal limits. Appendix appears normal. No evidence of bowel wall thickening, distention, or inflammatory changes. Noninflamed diverticula are seen within the descending and sigmoid colon. Vascular/Lymphatic: There is mild calcification of the abdominal aorta. No enlarged abdominal or pelvic lymph nodes. Reproductive: Uterus and bilateral adnexa are unremarkable. Other: No abdominal wall hernia or abnormality. No abdominopelvic ascites. Musculoskeletal: No acute or significant osseous findings. IMPRESSION: 1. Noninflamed diverticula within the descending and sigmoid colon. 2. 5 mm focus of parenchymal low attenuation within the posterior aspect of the liver dome, likely representing a small cyst or hemangioma. 3. Aortic atherosclerosis. Aortic Atherosclerosis (ICD10-I70.0). Electronically Signed   By: Virgina Norfolk M.D.   On:  03/19/2020 17:11    Procedures Procedures (including critical care time)  Medications Ordered in ED Medications  hydrochlorothiazide (HYDRODIURIL) tablet 25 mg (25 mg Oral Given 03/19/20 1819)  ondansetron (ZOFRAN-ODT) disintegrating tablet 4 mg (4 mg Oral Given 03/19/20 1557)  oxyCODONE-acetaminophen (PERCOCET/ROXICET) 5-325 MG per tablet 2 tablet (2 tablets Oral Given 03/19/20 1558)  amLODipine (NORVASC) tablet 5 mg (5 mg Oral Given 03/19/20 1819)  hydrALAZINE (APRESOLINE) tablet 25 mg (25 mg Oral Given 03/19/20 1943)  HYDROmorphone (DILAUDID) injection 1 mg (1 mg Intravenous Given 03/19/20 2000)  ondansetron (ZOFRAN) injection 4 mg (4 mg Intravenous Given 03/19/20 2000)  ED Course  I have reviewed the triage vital signs and the nursing notes.  Pertinent labs & imaging results that were available during my care of the patient were reviewed by me and considered in my medical decision making (see chart for details).    MDM Rules/Calculators/A&P                      MDM:  Pt given zofran and percocet.  Pt reports no relief.  Pt was given clonidine at urgent care.  Pt given her medications.  Pt has continued pain.  Pt given IV dilaudid and zofran.   Pt given hydrazine 24mg .  Labs reviewed with pt.  I counseled on need to take her blood pressure medications.   Final Clinical Impression(s) / ED Diagnoses Final diagnoses:  Low back pain without sciatica, unspecified back pain laterality, unspecified chronicity  Hypertension, unspecified type    Rx / DC Orders ED Discharge Orders         Ordered    hydrochlorothiazide (HYDRODIURIL) 25 MG tablet  Daily     03/19/20 2128    lisinopril (ZESTRIL) 10 MG tablet  Daily     03/19/20 2128    HYDROcodone-acetaminophen (NORCO/VICODIN) 5-325 MG tablet  Every 6 hours PRN     03/19/20 2131        An After Visit Summary was printed and given to the patient.   2132 03/19/20 2149    2150, MD 03/23/20 [phone]

## 2020-03-19 NOTE — ED Triage Notes (Signed)
Lower back pain for 2 days. She reports fatigue and took an "extra antibiotic" in case of UTI. No dysuria.   No known injury / strain.

## 2020-03-19 NOTE — Discharge Instructions (Signed)
Please go to emergency room for further evaluation and management of blood pressure  -Hypertensive urgency/emergency- signs of kidney damage(hematuria and protein in urine), headache and chest tightness

## 2020-03-19 NOTE — ED Triage Notes (Signed)
Pt arrives to ED with c/o of low back pain and right flank for 3 days. Pt was sent from UC due to HTN- pt has history of HTN and has not had medication for 1 year. Pt denies HA, blurry vision or dizziness.

## 2020-03-20 LAB — URINE CULTURE: Culture: <10000 — AB

## 2020-09-10 ENCOUNTER — Encounter (HOSPITAL_COMMUNITY): Payer: Self-pay | Admitting: Emergency Medicine

## 2020-09-10 ENCOUNTER — Emergency Department (HOSPITAL_COMMUNITY)
Admission: EM | Admit: 2020-09-10 | Discharge: 2020-09-10 | Disposition: A | Discharge status: Home / Self Care | Payer: 59 | Attending: Emergency Medicine | Admitting: Emergency Medicine

## 2020-09-10 ENCOUNTER — Other Ambulatory Visit: Payer: Self-pay

## 2020-09-10 ENCOUNTER — Emergency Department (HOSPITAL_COMMUNITY): Payer: 59

## 2020-09-10 DIAGNOSIS — R109 Unspecified abdominal pain: Secondary | ICD-10-CM | POA: Diagnosis not present

## 2020-09-10 DIAGNOSIS — I10 Essential (primary) hypertension: Secondary | ICD-10-CM | POA: Insufficient documentation

## 2020-09-10 DIAGNOSIS — Z20822 Contact with and (suspected) exposure to covid-19: Secondary | ICD-10-CM | POA: Diagnosis not present

## 2020-09-10 DIAGNOSIS — R1110 Vomiting, unspecified: Secondary | ICD-10-CM | POA: Diagnosis present

## 2020-09-10 DIAGNOSIS — R197 Diarrhea, unspecified: Secondary | ICD-10-CM

## 2020-09-10 DIAGNOSIS — J069 Acute upper respiratory infection, unspecified: Secondary | ICD-10-CM | POA: Diagnosis not present

## 2020-09-10 DIAGNOSIS — Z79899 Other long term (current) drug therapy: Secondary | ICD-10-CM | POA: Diagnosis not present

## 2020-09-10 DIAGNOSIS — F17200 Nicotine dependence, unspecified, uncomplicated: Secondary | ICD-10-CM | POA: Diagnosis not present

## 2020-09-10 LAB — URINALYSIS, ROUTINE W REFLEX MICROSCOPIC
Bilirubin Urine: NEGATIVE
Glucose, UA: NEGATIVE mg/dL
Hgb urine dipstick: NEGATIVE
Ketones, ur: NEGATIVE mg/dL
Leukocytes,Ua: NEGATIVE
Nitrite: NEGATIVE
Protein, ur: NEGATIVE mg/dL
Specific Gravity, Urine: 1.004 — ABNORMAL LOW (ref 1.005–1.030)
pH: 7 (ref 5.0–8.0)

## 2020-09-10 LAB — COMPREHENSIVE METABOLIC PANEL
ALT: 18 U/L (ref 0–44)
AST: 20 U/L (ref 15–41)
Albumin: 4.2 g/dL (ref 3.5–5.0)
Alkaline Phosphatase: 53 U/L (ref 38–126)
Anion gap: 8 (ref 5–15)
BUN: 5 mg/dL — ABNORMAL LOW (ref 6–20)
CO2: 24 mmol/L (ref 22–32)
Calcium: 9 mg/dL (ref 8.9–10.3)
Chloride: 102 mmol/L (ref 98–111)
Creatinine, Ser: 0.69 mg/dL (ref 0.44–1.00)
GFR, Estimated: >60 mL/min (ref >60)
Glucose, Bld: 123 mg/dL — ABNORMAL HIGH (ref 70–99)
Potassium: 3.8 mmol/L (ref 3.5–5.1)
Sodium: 134 mmol/L — ABNORMAL LOW (ref 135–145)
Total Bilirubin: 0.7 mg/dL (ref 0.3–1.2)
Total Protein: 7.6 g/dL (ref 6.5–8.1)

## 2020-09-10 LAB — RESP PANEL BY RT-PCR (FLU A&B, COVID) ARPGX2
Influenza A by PCR: NEGATIVE
Influenza B by PCR: NEGATIVE
SARS Coronavirus 2 by RT PCR: NEGATIVE

## 2020-09-10 LAB — CBC
HCT: 42 % (ref 36.0–46.0)
Hemoglobin: 13.6 g/dL (ref 12.0–15.0)
MCH: 31.9 pg (ref 26.0–34.0)
MCHC: 32.4 g/dL (ref 30.0–36.0)
MCV: 98.6 fL (ref 80.0–100.0)
Platelets: 259 10*3/uL (ref 150–400)
RBC: 4.26 MIL/uL (ref 3.87–5.11)
RDW: 13 % (ref 11.5–15.5)
WBC: 7.8 10*3/uL (ref 4.0–10.5)
nRBC: 0 % (ref 0.0–0.2)

## 2020-09-10 LAB — LIPASE, BLOOD: Lipase: 27 U/L (ref 11–51)

## 2020-09-10 LAB — I-STAT BETA HCG BLOOD, ED (MC, WL, AP ONLY): I-stat hCG, quantitative: <5 m[IU]/mL (ref <5)

## 2020-09-10 MED ORDER — BENZONATATE 100 MG PO CAPS: 100.0000 mg | ORAL_CAPSULE | Freq: Three times a day (TID) | ORAL | 0 refills | Status: DC

## 2020-09-10 MED ORDER — SODIUM CHLORIDE 0.9 % IV BOLUS
1000.0000 mL | Freq: Once | INTRAVENOUS | Status: AC
  Administered 2020-09-10: 1000 mL via INTRAVENOUS

## 2020-09-10 MED ORDER — KETOROLAC TROMETHAMINE 30 MG/ML IJ SOLN
30.0000 mg | Freq: Once | INTRAMUSCULAR | Status: AC
  Administered 2020-09-10: 30 mg via INTRAVENOUS
  Filled 2020-09-10: qty 1

## 2020-09-10 MED ORDER — ONDANSETRON HCL 4 MG/2ML IJ SOLN
4.0000 mg | Freq: Once | INTRAMUSCULAR | Status: AC
  Administered 2020-09-10: 4 mg via INTRAVENOUS
  Filled 2020-09-10: qty 2

## 2020-09-10 MED ORDER — ONDANSETRON 4 MG PO TBDP: 4.0000 mg | ORAL_TABLET | Freq: Three times a day (TID) | ORAL | 0 refills | Status: DC | PRN

## 2020-09-10 NOTE — Discharge Instructions (Addendum)
Please read instructions below.  It is important you take your blood pressure medications. You can alternate Tylenol/acetaminophen and Advil/ibuprofen/Motrin every 4 hours for sore throat, body aches, headache or fever.  Drink plenty of water.  Use saline nasal spray for congestion. You can take Tessalon every 8 hours as needed for cough. Wash your hands frequently. Follow up with your primary care provider to follow up on your visit today and recheck your blood pressure. Return to the ER for significant shortness of breath, uncontrollable vomiting, severe chest pain, or other concerning symptoms.

## 2020-09-10 NOTE — ED Provider Notes (Signed)
[facility] EMERGENCY DEPARTMENT Provider Note   CSN: [id number] Arrival date & time: 09/10/20  0321     History Chief Complaint  Patient presents with  . Shortness of Breath  . Cough  . Emesis    [name] is a 44 y.o. female past medical history of hypertension, presenting to the emergency department with complaint of URI symptoms, diarrhea and vomiting that began Monday morning.  Patient endorses nasal congestion, dry cough, headache, nausea/vomiting, and diarrhea.  At some mild abdominal pain.  States she is having trouble keeping fluids down is having diarrhea throughout the night last night.  She is treated her symptoms with NyQuil and Mucinex without much relief.  No known sick contacts though did spend the Thanksgiving holiday with family.  Has not received COVID-19 vaccines.  No vision changes, neck pain or stiffness.  No body aches or fever.  The history is provided by the patient.       Past Medical History:  Diagnosis Date  . Hypertension     There are no problems to display for this patient.   Past Surgical History:  Procedure Laterality Date  . TUBAL LIGATION       OB History    Gravida  7   Para  3   Term  3   Preterm      AB  4   Living  3     SAB  2   TAB  2   Ectopic      Multiple      Live Births              Family History  Problem Relation Age of Onset  . Cancer Father        throat and stomach  . Breast cancer Paternal Grandmother     Social History   Tobacco Use  . Smoking status: Current Some Day Smoker    Packs/day: 0.50  . Smokeless tobacco: Never Used  Substance Use Topics  . Alcohol use: Yes    Comment: occ  . Drug use: Yes    Types: Marijuana    Home Medications Prior to Admission medications   Medication Sig Start Date End Date Taking? Authorizing Provider  benzonatate (TESSALON) 100 MG capsule Take 1 capsule (100 mg total) by mouth every 8 (eight) hours. 09/10/20   Jennifermarie Franzen,  [place] N, PA-C  hydrochlorothiazide (HYDRODIURIL) 25 MG tablet Take 1 tablet (25 mg total) by mouth daily. 03/19/20   Elson Areas, PA-C  HYDROcodone-acetaminophen (NORCO/VICODIN) 5-325 MG tablet Take 2 tablets by mouth every 6 (six) hours as needed for moderate pain. 03/19/20 03/19/21  Elson Areas, PA-C  lisinopril (ZESTRIL) 10 MG tablet Take 1 tablet (10 mg total) by mouth daily. 03/19/20 03/19/21  Elson Areas, PA-C  ondansetron (ZOFRAN ODT) 4 MG disintegrating tablet Take 1 tablet (4 mg total) by mouth every 8 (eight) hours as needed for nausea or vomiting. 09/10/20   Kaoir Loree, [place] N, PA-C  amLODipine (NORVASC) 5 MG tablet Take 1 tablet (5 mg total) by mouth daily. Patient not taking: Reported on 03/19/2020 04/20/18 03/19/20  Mortis, Jerrel Ivory I, PA-C  potassium chloride (K-DUR) 10 MEQ tablet Take 3 tablets (30 mEq total) by mouth daily for 3 days. 08/08/18 03/19/20  Khatri, Hillary Bow, PA-C    Allergies    Motrin [ibuprofen]  Review of Systems   Review of Systems  All other systems reviewed and are negative.   Physical Exam Updated Vital  Signs BP (!) 200/115   Pulse 72   Temp 98.9 F (37.2 C) (Oral)   Resp 18   SpO2 99%   Physical Exam Vitals and nursing note reviewed.  Constitutional:      General: She is not in acute distress.    Appearance: She is well-developed.  HENT:     Head: Normocephalic and atraumatic.     Mouth/Throat:     Mouth: Mucous membranes are moist.  Eyes:     Conjunctiva/sclera: Conjunctivae normal.  Neck:     Comments: Full range motion of the neck without difficulty or pain Cardiovascular:     Rate and Rhythm: Normal rate and regular rhythm.     Heart sounds: Normal heart sounds.  Pulmonary:     Effort: Pulmonary effort is normal. No respiratory distress.     Breath sounds: Normal breath sounds.  Abdominal:     General: Bowel sounds are normal. There is no distension.     Palpations: Abdomen is soft.     Tenderness: There is no abdominal tenderness.  There is no guarding or rebound.  Musculoskeletal:     Cervical back: Normal range of motion and neck supple. No rigidity or tenderness.  Skin:    General: Skin is warm.  Neurological:     Mental Status: She is alert.  Psychiatric:        Behavior: Behavior normal.     ED Results / Procedures / Treatments   Labs (all labs ordered are listed, but only abnormal results are displayed) Labs Reviewed  COMPREHENSIVE METABOLIC PANEL - Abnormal; Notable for the following components:      Result Value   Sodium 134 (*)    Glucose, Bld 123 (*)    BUN 5 (*)    All other components within normal limits  URINALYSIS, ROUTINE W REFLEX MICROSCOPIC - Abnormal; Notable for the following components:   Color, Urine STRAW (*)    Specific Gravity, Urine 1.004 (*)    All other components within normal limits  RESP PANEL BY RT-PCR (FLU A&B, COVID) ARPGX2  LIPASE, BLOOD  CBC  I-STAT BETA HCG BLOOD, ED ([facility], [facility], [facility] ONLY)    EKG EKG Interpretation  Date/Time:  Tuesday September 10 2020 05:39:25 EST Ventricular Rate:  88 PR Interval:  144 QRS Duration: 68 QT Interval:  348 QTC Calculation: 421 R Axis:   66 Text Interpretation: Normal sinus rhythm Normal ECG No significant change since last tracing Confirmed by Alvira Monday ([id number]) on 09/10/2020 8:49:15 AM   Radiology DG Chest Portable 1 View  Result Date: 09/10/2020 CLINICAL DATA:  Dyspnea, cough EXAM: PORTABLE CHEST 1 VIEW COMPARISON:  07/15/2011 FINDINGS: The heart size and mediastinal contours are within normal limits. Both lungs are clear. The visualized skeletal structures are unremarkable. IMPRESSION: No active disease. Electronically Signed   By: Helyn Numbers MD   On: 09/10/2020 06:34    Procedures Procedures (including critical care time)  Medications Ordered in ED Medications  ondansetron [facility]) injection 4 mg (4 mg Intravenous Given 09/10/20 0934)  sodium chloride 0.9 % bolus 1,000 mL (1,000 mLs Intravenous New Bag/Given  09/10/20 0933)  ketorolac (TORADOL) 30 MG/ML injection 30 mg (30 mg Intravenous Given 09/10/20 0934)    ED Course  I have reviewed the triage vital signs and the nursing notes.  Pertinent labs & imaging results that were available during my care of the patient were reviewed by me and considered in my medical decision making (see chart for details).  MDM Rules/Calculators/A&P                          Patients symptoms are consistent with likely viral illness. Afebrile, tolerating secretions.  Lungs clear to auscultation bilaterally. CXR negative for acute infiltrate.  Covid swab and flu swabs are negative.  Labs are reassuring.  She is treated with IV fluids, antiemetics, Toradol for headache with symptom improvement.  Tolerating p.o. fluids.  Stressed the importance of taking her blood pressure medications, as she has not been compliant and is noted to be hypertensive here.  Encouraged she get retested for Covid in the next few days if symptoms persist, home isolation precautions discussed.  Pt will be discharged with symptomatic treatment including Tessalon and Zofran.  Verbalizes understanding and is agreeable with plan. Pt is hemodynamically stable & in NAD prior to dc.  Discussed results, findings, treatment and follow up. Patient advised of return precautions. Patient verbalized understanding and agreed with plan.  Final Clinical Impression(s) / ED Diagnoses Final diagnoses:  Viral URI with cough  Vomiting and diarrhea  Hypertension, unspecified type    Rx / DC Orders ED Discharge Orders         Ordered    benzonatate (TESSALON) 100 MG capsule  Every 8 hours        09/10/20 1204    ondansetron (ZOFRAN ODT) 4 MG disintegrating tablet  Every 8 hours PRN        09/10/20 1204           Ky Rumple, [place] N, [place] 09/10/20 1209    Alvira Monday, MD 09/13/20 [phone]

## 2020-09-10 NOTE — ED Triage Notes (Signed)
Patient here with nausea/vomiting/diarrhea and URI symptoms, including cough.  Patient states that she has not been able to keep anything down. She states that it has been going on a couple of days.  Patient states she does have some shortness of breath with the cough.  Patient is unvaccinated for Covid.  She states she just doesn't feel good.

## 2021-01-23 ENCOUNTER — Ambulatory Visit (INDEPENDENT_AMBULATORY_CARE_PROVIDER_SITE_OTHER): Payer: 59 | Admitting: Internal Medicine

## 2021-01-23 ENCOUNTER — Other Ambulatory Visit: Payer: Self-pay

## 2021-01-23 ENCOUNTER — Encounter: Payer: Self-pay | Admitting: Internal Medicine

## 2021-01-23 VITALS — BP 200/130 | HR 81 | Temp 98.3°F | Ht 67.0 in | Wt 157.4 lb

## 2021-01-23 DIAGNOSIS — Z124 Encounter for screening for malignant neoplasm of cervix: Secondary | ICD-10-CM | POA: Diagnosis not present

## 2021-01-23 DIAGNOSIS — F1020 Alcohol dependence, uncomplicated: Secondary | ICD-10-CM

## 2021-01-23 DIAGNOSIS — F17200 Nicotine dependence, unspecified, uncomplicated: Secondary | ICD-10-CM | POA: Insufficient documentation

## 2021-01-23 DIAGNOSIS — F17210 Nicotine dependence, cigarettes, uncomplicated: Secondary | ICD-10-CM

## 2021-01-23 DIAGNOSIS — F1490 Cocaine use, unspecified, uncomplicated: Secondary | ICD-10-CM | POA: Diagnosis not present

## 2021-01-23 DIAGNOSIS — N6310 Unspecified lump in the right breast, unspecified quadrant: Secondary | ICD-10-CM | POA: Diagnosis not present

## 2021-01-23 DIAGNOSIS — I10 Essential (primary) hypertension: Secondary | ICD-10-CM

## 2021-01-23 MED ORDER — VALSARTAN-HYDROCHLOROTHIAZIDE 160-25 MG PO TABS: 1.0000 | ORAL_TABLET | Freq: Every day | ORAL | 1 refills | Status: DC

## 2021-01-23 MED ORDER — AMLODIPINE BESYLATE 5 MG PO TABS: 5.0000 mg | ORAL_TABLET | Freq: Every day | ORAL | 1 refills | Status: DC

## 2021-01-23 NOTE — Progress Notes (Signed)
New Patient Office Visit     This visit occurred during the SARS-CoV-2 public health emergency.  Safety protocols were in place, including screening questions prior to the visit, additional usage of staff PPE, and extensive cleaning of exam room while observing appropriate contact time as indicated for disinfecting solutions.    CC/Reason for Visit: Establish care, discuss chronic conditions and an acute concern Previous PCP: Unknown Last Visit: Unknown  HPI: [name] is a 45 y.o. female who is coming in today for the above mentioned reasons. Past Medical History is significant for: Hypertension but who has not been on medication in a long time.  She admits to a significant alcohol use disorder.  She tells me that she only drinks on weekends but when she does she binge drinks and passes out almost every time.  She also uses marijuana and cocaine, last use was last weekend.  Denies intravenous drug use.  She smokes about half to a full pack a day.  She has had 3 C-sections.  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  Her family history significant for mother with hypertension a paternal grandmother with breast cancer and her father had some unknown type of cancer and HIV.  She now has medical insurance and would like to get back in with her medical care.  She has been having a headache most days.  She has noticed a right breast lump at around the 9 o'clock position.  She tells me that around 2016 she had a biopsy of that area that was benign.   Past Medical/Surgical History: Past Medical History:  Diagnosis Date  . Cocaine use   . Hypertension   . Malignant hypertension   . Nicotine dependence     Past Surgical History:  Procedure Laterality Date  . CESAREAN SECTION     x3  . TUBAL LIGATION      Social History:  reports that she has been smoking. She has been smoking about 0.50 packs per day. She has never used smokeless tobacco. She reports current alcohol use.  She reports current drug use. Drugs: Marijuana and "Crack" cocaine.  Allergies: Allergies  Allergen Reactions  . Motrin [Ibuprofen] Nausea Only and Other (See Comments)    Causes a stomach ache    Family History:  Family History  Problem Relation Age of Onset  . Cancer Father        throat and stomach  . HIV Father   . Breast cancer Paternal Grandmother   . Hypertension Mother      Current Outpatient Medications:  .  amLODipine (NORVASC) 5 MG tablet, Take 1 tablet (5 mg total) by mouth daily., Disp: 90 tablet, Rfl: 1 .  valsartan-hydrochlorothiazide (DIOVAN-HCT) 160-25 MG tablet, Take 1 tablet by mouth daily., Disp: 90 tablet, Rfl: 1  Review of Systems:  Constitutional: Denies fever, chills, diaphoresis, appetite change and fatigue.  HEENT: Denies photophobia, eye pain, redness, hearing loss, ear pain, congestion, sore throat, rhinorrhea, sneezing, mouth sores, trouble swallowing, neck pain, neck stiffness and tinnitus.   Respiratory: Denies SOB, DOE, cough, chest tightness,  and wheezing.   Cardiovascular: Denies chest pain, palpitations and leg swelling.  Gastrointestinal: Denies nausea, vomiting, abdominal pain, diarrhea, constipation, blood in stool and abdominal distention.  Genitourinary: Denies dysuria, urgency, frequency, hematuria, flank pain and difficulty urinating.  Endocrine: Denies: hot or cold intolerance, sweats, changes in hair or nails, polyuria, polydipsia. Musculoskeletal: Denies myalgias, back pain, joint swelling, arthralgias and gait problem.  Skin: Denies pallor, rash and wound.  Neurological: Denies dizziness, seizures, syncope, weakness, light-headedness, numbness. Hematological: Denies adenopathy. Easy bruising, personal or family bleeding history  Psychiatric/Behavioral: Denies suicidal ideation, mood changes, confusion, nervousness, sleep disturbance and agitation    Physical Exam: Vitals:   01/23/21 1555  BP: (!) 200/130  Pulse: 81  Temp:  98.3 F (36.8 C)  TempSrc: Oral  SpO2: 99%  Weight: 157 lb 6.4 oz (71.4 kg)  Height: 5\' 7"  (1.702 m)   Body mass index is 24.65 kg/m.   Constitutional: NAD, calm, comfortable Eyes: PERRL, lids and conjunctivae normal ENMT: Mucous membranes are moist. Respiratory: clear to auscultation bilaterally, no wheezing, no crackles. Normal respiratory effort. No accessory muscle use.  Cardiovascular: Regular rate and rhythm, no murmurs / rubs / gallops. No extremity edema. 2+ pedal pulses.  Neurologic: Grossly intact and nonfocal Psychiatric: Normal judgment and insight. Alert and oriented x 3. Normal mood.    Impression and Plan:  Malignant hypertension  -Extremely elevated blood pressure in office today to 200/130.  This has been confirmed on remeasurement. -Labs have been ordered today. -She will be started on amlodipine 5 mg daily as well as Diovan HCT 160/25 mg daily. -She has been encouraged to do daily ambulatory blood pressure monitoring. -I have asked her to return in 2 weeks for follow-up. -She knows to proceed to the emergency department if she develops chest pain or shortness of breath. -Her headache is likely related to her elevated blood pressures. -Cocaine use likely contributes.  Screening for cervical cancer  - Plan: Ambulatory referral to Gynecology  Lump of right breast  - Plan: MM Digital Screening, MM Digital Diagnostic Unilat R, BREAST LTD UNI RIGHT INC AXILLA  Cocaine use  - Plan: DRUG MONITORING, PANEL 8 WITH CONFIRMATION -We have talked about cessation.   Cigarette nicotine dependence without complication -We have not had time to discuss this issue today.  Alcohol use disorder, severe, dependence (HCC) -We will need to discuss this issue in depth at a later time.    Patient Instructions  -Nice seeing you today!!  -Lab work today; will notify you once results are available.  -Start amlodipine 5 mg daily and Valsartan/HCT 160/25 mg daily for your  blood pressure.  -Check BP every day and bring measurements into your next visit.  -Schedule follow up in 2 weeks.  -Mammogram has been ordered.     [place], MD [facility]

## 2021-01-23 NOTE — Patient Instructions (Signed)
-  Nice seeing you today!!  -Lab work today; will notify you once results are available.  -Start amlodipine 5 mg daily and Valsartan/HCT 160/25 mg daily for your blood pressure.  -Check BP every day and bring measurements into your next visit.  -Schedule follow up in 2 weeks.  -Mammogram has been ordered.

## 2021-01-25 LAB — CBC WITH DIFFERENTIAL/PLATELET
Absolute Monocytes: 510 cells/uL (ref 200–950)
Basophils Absolute: 52 cells/uL (ref 0–200)
Basophils Relative: 0.9 %
Eosinophils Absolute: 93 cells/uL (ref 15–500)
Eosinophils Relative: 1.6 %
HCT: 38.3 % (ref 35.0–45.0)
Hemoglobin: 12.9 g/dL (ref 11.7–15.5)
Lymphs Abs: 2291 cells/uL (ref 850–3900)
MCH: 32.6 pg (ref 27.0–33.0)
MCHC: 33.7 g/dL (ref 32.0–36.0)
MCV: 96.7 fL (ref 80.0–100.0)
MPV: 11.3 fL (ref 7.5–12.5)
Monocytes Relative: 8.8 %
Neutro Abs: 2854 cells/uL (ref 1500–7800)
Neutrophils Relative %: 49.2 %
Platelets: 234 10*3/uL (ref 140–400)
RBC: 3.96 10*6/uL (ref 3.80–5.10)
RDW: 12.5 % (ref 11.0–15.0)
Total Lymphocyte: 39.5 %
WBC: 5.8 10*3/uL (ref 3.8–10.8)

## 2021-01-25 LAB — DRUG MONITORING, PANEL 8 WITH CONFIRMATION, URINE
6 Acetylmorphine: NEGATIVE ng/mL (ref <10)
Alcohol Metabolites: NEGATIVE ng/mL
Amphetamines: NEGATIVE ng/mL (ref <500)
Benzodiazepines: NEGATIVE ng/mL (ref <100)
Buprenorphine, Urine: NEGATIVE ng/mL (ref <5)
Cocaine Metabolite: NEGATIVE ng/mL (ref <150)
Creatinine: 156.8 mg/dL
MDMA: NEGATIVE ng/mL (ref <500)
Marijuana Metabolite: 276 ng/mL — ABNORMAL HIGH (ref <5)
Marijuana Metabolite: POSITIVE ng/mL — AB (ref <20)
Opiates: NEGATIVE ng/mL (ref <100)
Oxidant: NEGATIVE ug/mL
Oxycodone: NEGATIVE ng/mL (ref <100)
pH: 6.5 (ref 4.5–9.0)

## 2021-01-25 LAB — COMPREHENSIVE METABOLIC PANEL
AG Ratio: 1.8 (calc) (ref 1.0–2.5)
ALT: 14 U/L (ref 6–29)
AST: 16 U/L (ref 10–30)
Albumin: 4.8 g/dL (ref 3.6–5.1)
Alkaline phosphatase (APISO): 56 U/L (ref 31–125)
BUN: 12 mg/dL (ref 7–25)
CO2: 27 mmol/L (ref 20–32)
Calcium: 9.7 mg/dL (ref 8.6–10.2)
Chloride: 103 mmol/L (ref 98–110)
Creat: 0.86 mg/dL (ref 0.50–1.10)
Globulin: 2.6 g/dL (calc) (ref 1.9–3.7)
Glucose, Bld: 78 mg/dL (ref 65–99)
Potassium: 4 mmol/L (ref 3.5–5.3)
Sodium: 138 mmol/L (ref 135–146)
Total Bilirubin: 0.4 mg/dL (ref 0.2–1.2)
Total Protein: 7.4 g/dL (ref 6.1–8.1)

## 2021-01-25 LAB — DM TEMPLATE

## 2021-01-28 ENCOUNTER — Telehealth: Payer: Self-pay | Admitting: Internal Medicine

## 2021-01-28 NOTE — Telephone Encounter (Signed)
Okay for note?  I called and scheduled the earliest mammogram for 03/05/21 at 1 am.  Patient can call daily to see if there is a cancellation.

## 2021-01-28 NOTE — Telephone Encounter (Signed)
Pt call and stated she need a note stated she spoke about back pain and she also stated that she didn't get a call about her mammogram .

## 2021-01-29 ENCOUNTER — Encounter: Payer: Self-pay | Admitting: Internal Medicine

## 2021-01-29 NOTE — Telephone Encounter (Signed)
Letter sent to MyChart and patient is aware.

## 2021-01-29 NOTE — Telephone Encounter (Signed)
Patient is aware of her appointment for her mammogram and her lab results.  Patient is requesting a note for work for the day she was seen and did mention her lower back pain.  Okay to write?

## 2021-02-10 ENCOUNTER — Other Ambulatory Visit: Payer: Self-pay

## 2021-02-11 ENCOUNTER — Encounter: Payer: Self-pay | Admitting: Internal Medicine

## 2021-02-11 ENCOUNTER — Ambulatory Visit (INDEPENDENT_AMBULATORY_CARE_PROVIDER_SITE_OTHER): Payer: 59 | Admitting: Internal Medicine

## 2021-02-11 VITALS — BP 142/94 | HR 79 | Temp 98.6°F | Wt 157.8 lb

## 2021-02-11 DIAGNOSIS — I10 Essential (primary) hypertension: Secondary | ICD-10-CM | POA: Diagnosis not present

## 2021-02-11 NOTE — Patient Instructions (Signed)
-  Nice seeing you today!!  -Schedule follow up in 8 weeks for your physical. Please come in fasting that day.

## 2021-02-11 NOTE — Progress Notes (Signed)
Established Patient Office Visit     This visit occurred during the SARS-CoV-2 public health emergency.  Safety protocols were in place, including screening questions prior to the visit, additional usage of staff PPE, and extensive cleaning of exam room while observing appropriate contact time as indicated for disinfecting solutions.    CC/Reason for Visit: Blood pressure follow-up  HPI: [name] is a 45 y.o. female who is coming in today for the above mentioned reasons.  She was seen on April 14 as a new patient.  At that time she was noticed to have a blood pressure of 200/130.  She was having daily headaches.  She was started on amlodipine 5 mg as well as Diovan HCT.  She has been compliant with medication.  She is tolerating medication well.  She has noticed a slow decrease in her blood pressure.  Past Medical/Surgical History: Past Medical History:  Diagnosis Date  . Cocaine use   . Hypertension   . Malignant hypertension   . Nicotine dependence     Past Surgical History:  Procedure Laterality Date  . CESAREAN SECTION     x3  . TUBAL LIGATION      Social History:  reports that she has been smoking. She has been smoking about 0.50 packs per day. She has never used smokeless tobacco. She reports current alcohol use. She reports current drug use. Drugs: Marijuana and "Crack" cocaine.  Allergies: Allergies  Allergen Reactions  . Motrin [Ibuprofen] Nausea Only and Other (See Comments)    Causes a stomach ache    Family History:  Family History  Problem Relation Age of Onset  . Cancer Father        throat and stomach  . HIV Father   . Breast cancer Paternal Grandmother   . Hypertension Mother      Current Outpatient Medications:  .  amLODipine (NORVASC) 5 MG tablet, Take 1 tablet (5 mg total) by mouth daily., Disp: 90 tablet, Rfl: 1 .  valsartan-hydrochlorothiazide (DIOVAN-HCT) 160-25 MG tablet, Take 1 tablet by mouth daily., Disp: 90 tablet, Rfl:  1  Review of Systems:  Constitutional: Denies fever, chills, diaphoresis, appetite change and fatigue.  HEENT: Denies photophobia, eye pain, redness, hearing loss, ear pain, congestion, sore throat, rhinorrhea, sneezing, mouth sores, trouble swallowing, neck pain, neck stiffness and tinnitus.   Respiratory: Denies SOB, DOE, cough, chest tightness,  and wheezing.   Cardiovascular: Denies chest pain, palpitations and leg swelling.  Gastrointestinal: Denies nausea, vomiting, abdominal pain, diarrhea, constipation, blood in stool and abdominal distention.  Genitourinary: Denies dysuria, urgency, frequency, hematuria, flank pain and difficulty urinating.  Endocrine: Denies: hot or cold intolerance, sweats, changes in hair or nails, polyuria, polydipsia. Musculoskeletal: Denies myalgias, back pain, joint swelling, arthralgias and gait problem.  Skin: Denies pallor, rash and wound.  Neurological: Denies dizziness, seizures, syncope, weakness, light-headedness, numbness and headaches.  Hematological: Denies adenopathy. Easy bruising, personal or family bleeding history  Psychiatric/Behavioral: Denies suicidal ideation, mood changes, confusion, nervousness, sleep disturbance and agitation    Physical Exam: Vitals:   02/11/21 1523  BP: (!) 142/94  Pulse: 79  Temp: 98.6 F (37 C)  TempSrc: Oral  SpO2: 97%  Weight: 157 lb 12.8 oz (71.6 kg)    Body mass index is 24.71 kg/m.   Constitutional: NAD, calm, comfortable Eyes: PERRL, lids and conjunctivae normal ENMT: Mucous membranes are moist.  Respiratory: clear to auscultation bilaterally, no wheezing, no crackles. Normal respiratory effort. No accessory muscle use.  Cardiovascular: Regular rate and rhythm, no murmurs / rubs / gallops. No extremity edema.  Neurologic: Grossly intact and nonfocal Psychiatric: Normal judgment and insight. Alert and oriented x 3. Normal mood.    Impression and Plan:  Malignant hypertension -Blood pressure  is improving, down to 142/94 from 200/130 2 weeks ago. -Continue amlodipine 5 mg as well as Diovan HCT 160/25 mg daily. -She will return in 8 weeks for annual physical.  Time spent: 20 minutes collecting history and formulating plan of care.   Patient Instructions  -Nice seeing you today!!  -Schedule follow up in 8 weeks for your physical. Please come in fasting that day.     Chaya Jan, MD [facility]

## 2021-02-21 ENCOUNTER — Other Ambulatory Visit: Payer: Self-pay

## 2021-02-21 ENCOUNTER — Ambulatory Visit (INDEPENDENT_AMBULATORY_CARE_PROVIDER_SITE_OTHER): Payer: 59 | Admitting: Nurse Practitioner

## 2021-02-21 ENCOUNTER — Other Ambulatory Visit (HOSPITAL_COMMUNITY)
Admission: RE | Admit: 2021-02-21 | Discharge: 2021-02-21 | Disposition: A | Discharge status: Home / Self Care | Payer: 59 | Source: Ambulatory Visit | Attending: Nurse Practitioner | Admitting: Nurse Practitioner

## 2021-02-21 ENCOUNTER — Encounter: Payer: Self-pay | Admitting: Nurse Practitioner

## 2021-02-21 VITALS — BP 156/105 | Ht 66.0 in | Wt 159.0 lb

## 2021-02-21 DIAGNOSIS — A599 Trichomoniasis, unspecified: Secondary | ICD-10-CM

## 2021-02-21 DIAGNOSIS — Z113 Encounter for screening for infections with a predominantly sexual mode of transmission: Secondary | ICD-10-CM | POA: Insufficient documentation

## 2021-02-21 DIAGNOSIS — N898 Other specified noninflammatory disorders of vagina: Secondary | ICD-10-CM

## 2021-02-21 DIAGNOSIS — N760 Acute vaginitis: Secondary | ICD-10-CM

## 2021-02-21 DIAGNOSIS — Z01419 Encounter for gynecological examination (general) (routine) without abnormal findings: Secondary | ICD-10-CM | POA: Insufficient documentation

## 2021-02-21 DIAGNOSIS — Z1159 Encounter for screening for other viral diseases: Secondary | ICD-10-CM

## 2021-02-21 DIAGNOSIS — R030 Elevated blood-pressure reading, without diagnosis of hypertension: Secondary | ICD-10-CM

## 2021-02-21 DIAGNOSIS — N946 Dysmenorrhea, unspecified: Secondary | ICD-10-CM

## 2021-02-21 DIAGNOSIS — B9689 Other specified bacterial agents as the cause of diseases classified elsewhere: Secondary | ICD-10-CM

## 2021-02-21 LAB — WET PREP FOR TRICH, YEAST, CLUE

## 2021-02-21 MED ORDER — METRONIDAZOLE 500 MG PO TABS: 500.0000 mg | ORAL_TABLET | Freq: Two times a day (BID) | ORAL | 0 refills | Status: DC

## 2021-02-21 NOTE — Progress Notes (Signed)
45 y.o. [id number]  Black or African American female here for annual exam.    Period Cycle (Days): 28 Period Duration (Days): 4 Period Pattern: Regular Menstrual Flow: Moderate Dysmenorrhea: (!) Severe since beginning of year Dysmenorrhea Symptoms: Cramping Patient's last menstrual period was 02/13/2021.          Most recent period was darker than usual, and lasted a total of 7 days Wants to be tested for STD. She feels like it has been about 15 years since last pap smear.  When she was in a good home many years ago, she thinks she was told she had genital warts.  Recently seen by PCP. Pt is focused on getting her health back and getting life back on track. Has stopped all cocaine use. She has always tried to be the best mom she could be and wants to continue focusing on her kids, 21,18 and 13. She now is a grandmother of a 58 year old. Has a job in health care, working at dialysis clinic.  Just started on B/P medicaton, but did not take it today yet, but has been doing good.  She had an abnormal mammogram in 2016. She has a diagnostic mammogram scheduled (by PCP) on May 25. Sometimes she has leaking from right nipple on her bra.   Sexually active: Yes.   Female partner x 20 years The current method of family planning is tubal ligation.    Exercising: No.  planning to start walking daily Smoker:  yes  Health Maintenance: Pap:Pap 15 yrs. History of abnormal Pap:  yes MMG: 2016 Colonoscopy:never BMD: never Gardasil:never Covid-19:Phizer Hep C testing:never Screening Labs:PCP   reports that she has been smoking. She has been smoking about 0.50 packs per day. She has never used smokeless tobacco. She reports current alcohol use. She reports previous drug use. Drugs: Marijuana and Cocaine.  Past Medical History:  Diagnosis Date  . Cocaine use   . Hypertension   . Malignant hypertension   . Nicotine dependence     Past Surgical History:  Procedure Laterality Date  . CESAREAN  SECTION     x3  . TUBAL LIGATION      Current Outpatient Medications  Medication Sig Dispense Refill  . amLODipine (NORVASC) 5 MG tablet Take 1 tablet (5 mg total) by mouth daily. 90 tablet 1  . metroNIDAZOLE (FLAGYL) 500 MG tablet Take 1 tablet (500 mg total) by mouth 2 (two) times daily. 14 tablet 0  . valsartan-hydrochlorothiazide (DIOVAN-HCT) 160-25 MG tablet Take 1 tablet by mouth daily. 90 tablet 1   No current facility-administered medications for this visit.    Family History  Problem Relation Age of Onset  . Cancer Father        throat and stomach  . HIV Father   . Breast cancer Paternal Grandmother   . Hypertension Mother     Review of Systems  All other systems reviewed and are negative.   Exam:   BP (!) 156/105   Ht 5\' 6"  (1.676 m)   Wt 159 lb (72.1 kg)   LMP 02/13/2021   BMI 25.66 kg/m   Height: 5\' 6"  (167.6 cm)  General appearance: alert, cooperative and appears stated age, no acute distress Head: Normocephalic, without obvious abnormality Neck: no adenopathy, thyroid normal to inspection and palpation Lungs: clear to auscultation bilaterally Breasts: positive findings: right breast with elongated structure 9 o'clock position, ~3 cm in length. Unable to express nipple disharge Heart: regular rate and rhythm Abdomen: soft, non-tender;  no masses,  no organomegaly Extremities: extremities normal, no edema Skin: No rashes or lesions Lymph nodes: Cervical, supraclavicular, and axillary nodes normal. No abnormal inguinal nodes palpated Neurologic: Grossly normal   Pelvic: External genitalia:  No visible lesions              Urethra:  normal appearing urethra with no masses, tenderness or lesions              Bartholins and Skenes: normal                 Vagina: normal appearing vagina, appropriate for age, minimal discharge, ph 5.5, no lesions noted              Cervix: neg cervical motion tenderness, no visible lesions             Bimanual Exam:    Uterus:  irregular, firm and mobile, does not palpate enlarged              Adnexa: no mass, fullness, tenderness                Wet mount: + trich, + BV Kim, CMA Chaperone was present for exam.  A/P:  Well woman exam - Plan: Cytology - PAP( [facility])  Screen for STD (sexually transmitted disease) - Plan: HIV antibody (with reflex), RPR, Cytology - PAP( [facility])  Vaginal odor - Plan: WET PREP FOR TRICH, YEAST, CLUE  Need for hepatitis C screening test - Plan: Hepatitis C Antibody  Trichomonas infection - Plan: metroNIDAZOLE (FLAGYL) 500 MG tablet Bacterial vaginosis  Elevated blood pressure reading- working with PCP  Dysmenorrhea- will see if treating infection is helpful. Pt unable to take Ibuprofen. May take Tylenol as needed.   F/U 3 months for re-screen trichomonas, will also discuss dysmenorrhea

## 2021-02-21 NOTE — Patient Instructions (Signed)
Health Maintenance, Female Adopting a healthy lifestyle and getting preventive care are important in promoting health and wellness. Ask your health care provider about:  The right schedule for you to have regular tests and exams.  Things you can do on your own to prevent diseases and keep yourself healthy. What should I know about diet, weight, and exercise? Eat a healthy diet  Eat a diet that includes plenty of vegetables, fruits, low-fat dairy products, and lean protein.  Do not eat a lot of foods that are high in solid fats, added sugars, or sodium.   Maintain a healthy weight Body mass index (BMI) is used to identify weight problems. It estimates body fat based on height and weight. Your health care provider can help determine your BMI and help you achieve or maintain a healthy weight. Get regular exercise Get regular exercise. This is one of the most important things you can do for your health. Most adults should:  Exercise for at least 150 minutes each week. The exercise should increase your heart rate and make you sweat (moderate-intensity exercise).  Do strengthening exercises at least twice a week. This is in addition to the moderate-intensity exercise.  Spend less time sitting. Even light physical activity can be beneficial. Watch cholesterol and blood lipids Have your blood tested for lipids and cholesterol at 43 years of age, then have this test every 5 years. Have your cholesterol levels checked more often if:  Your lipid or cholesterol levels are high.  You are older than 43 years of age.  You are at high risk for heart disease. What should I know about cancer screening? Depending on your health history and family history, you may need to have cancer screening at various ages. This may include screening for:  Breast cancer.  Cervical cancer.  Colorectal cancer.  Skin cancer.  Lung cancer. What should I know about heart disease, diabetes, and high blood  pressure? Blood pressure and heart disease  High blood pressure causes heart disease and increases the risk of stroke. This is more likely to develop in people who have high blood pressure readings, are of African descent, or are overweight.  Have your blood pressure checked: ? Every 3-5 years if you are 18-39 years of age. ? Every year if you are 40 years old or older. Diabetes Have regular diabetes screenings. This checks your fasting blood sugar level. Have the screening done:  Once every three years after age 40 if you are at a normal weight and have a low risk for diabetes.  More often and at a younger age if you are overweight or have a high risk for diabetes. What should I know about preventing infection? Hepatitis B If you have a higher risk for hepatitis B, you should be screened for this virus. Talk with your health care provider to find out if you are at risk for hepatitis B infection. Hepatitis C Testing is recommended for:  Everyone born from 1945 through 1965.  Anyone with known risk factors for hepatitis C. Sexually transmitted infections (STIs)  Get screened for STIs, including gonorrhea and chlamydia, if: ? You are sexually active and are younger than 43 years of age. ? You are older than 43 years of age and your health care provider tells you that you are at risk for this type of infection. ? Your sexual activity has changed since you were last screened, and you are at increased risk for chlamydia or gonorrhea. Ask your health care provider   if you are at risk.  Ask your health care provider about whether you are at high risk for HIV. Your health care provider may recommend a prescription medicine to help prevent HIV infection. If you choose to take medicine to prevent HIV, you should first get tested for HIV. You should then be tested every 3 months for as long as you are taking the medicine. Pregnancy  If you are about to stop having your period (premenopausal) and  you may become pregnant, seek counseling before you get pregnant.  Take 400 to 800 micrograms (mcg) of folic acid every day if you become pregnant.  Ask for birth control (contraception) if you want to prevent pregnancy. Osteoporosis and menopause Osteoporosis is a disease in which the bones lose minerals and strength with aging. This can result in bone fractures. If you are 32 years old or older, or if you are at risk for osteoporosis and fractures, ask your health care provider if you should:  Be screened for bone loss.  Take a calcium or vitamin D supplement to lower your risk of fractures.  Be given hormone replacement therapy (HRT) to treat symptoms of menopause. Follow these instructions at home: Lifestyle  Do not use any products that contain nicotine or tobacco, such as cigarettes, e-cigarettes, and chewing tobacco. If you need help quitting, ask your health care provider.  Do not use street drugs.  Do not share needles.  Ask your health care provider for help if you need support or information about quitting drugs. Alcohol use  Do not drink alcohol if: ? Your health care provider tells you not to drink. ? You are pregnant, may be pregnant, or are planning to become pregnant.  If you drink alcohol: ? Limit how much you use to 0-1 drink a day. ? Limit intake if you are breastfeeding.  Be aware of how much alcohol is in your drink. In the [place] one drink equals one 12 oz bottle of beer (355 mL), one 5 oz glass of wine (148 mL), or one 1 oz glass of hard liquor (44 mL). General instructions  Schedule regular health, dental, and eye exams.  Stay current with your vaccines.  Tell your health care provider if: ? You often feel depressed. ? You have ever been abused or do not feel safe at home. Summary  Adopting a healthy lifestyle and getting preventive care are important in promoting health and wellness.  Follow your health care provider's instructions about healthy  diet, exercising, and getting tested or screened for diseases.  Follow your health care provider's instructions on monitoring your cholesterol and blood pressure. This information is not intended to replace advice given to you by your health care provider. Make sure you discuss any questions you have with your health care provider. Document Revised: 09/21/2018 Document Reviewed: 09/21/2018 Elsevier Patient Education  2021 [employer] Inc[employer]    Trichomoniasis Trichomoniasis is an STI (sexually transmitted infection) that can affect both women and men. In women, the outer area of the female genitalia (vulva) and the vagina are affected. In men, mainly the penis is affected, but the prostate and other reproductive organs can also be involved.  This condition can be treated with medicine. It often has no symptoms (is asymptomatic), especially in men. If not treated, trichomoniasis can last for months or years. What are the causes? This condition is caused by a parasite called Trichomonas vaginalis. Trichomoniasis most often spreads from person to person (is contagious) through sexual contact. What increases  the risk? The following factors may make you more likely to develop this condition:  Having unprotected sex.  Having sex with a partner who has trichomoniasis.  Having multiple sexual partners.  Having had previous trichomoniasis infections or other STIs. What are the signs or symptoms? In women, symptoms of trichomoniasis include:  Abnormal vaginal discharge that is clear, white, gray, or yellow-Pulse and foamy and has an unusual "fishy" odor.  Itching and irritation of the vagina and vulva.  Burning or pain during urination or sex.  Redness and swelling of the genitals. In men, symptoms of trichomoniasis include:  Penile discharge that may be foamy or contain pus.  Pain in the penis. This may happen only when urinating.  Itching or irritation inside the penis.  Burning after  urination or ejaculation. How is this diagnosed? In women, this condition may be found during a routine Pap test or physical exam. It may be found in men during a routine physical exam. Your health care provider may do tests to help diagnose this infection, such as:  Urine tests (men and women).  The following in women: ? Testing the pH of the vagina. ? A vaginal swab test that checks for the Trichomonas vaginalis parasite. ? Testing vaginal secretions. Your health care provider may test you for other STIs, including HIV (human immunodeficiency virus). How is this treated? This condition is treated with medicine taken by mouth (orally), such as metronidazole or tinidazole, to fight the infection. Your sexual partner(s) also need to be tested and treated.  If you are a woman and you plan to become pregnant or think you may be pregnant, tell your health care provider right away. Some medicines that are used to treat the infection should not be taken during pregnancy. Your health care provider may recommend over-the-counter medicines or creams to help relieve itching or irritation. You may be tested for infection again 3 months after treatment.   Follow these instructions at home:  Take and use over-the-counter and prescription medicines, including creams, only as told by your health care provider.  Take your antibiotic medicine as told by your health care provider. Do not stop taking the antibiotic even if you start to feel better.  Do not have sex until 7-10 days after you finish your medicine, or until your health care provider approves. Ask your health care provider when you may start to have sex again.  (Women) Do not douche or wear tampons while you have the infection.  Discuss your infection with your sexual partner(s). Make sure that your partner gets tested and treated, if necessary.  Keep all follow-up visits as told by your health care provider. This is important. How is this  prevented?  Use condoms every time you have sex. Using condoms correctly and consistently can help protect against STIs.  Avoid having multiple sexual partners.  Talk with your sexual partner about any symptoms that either of you may have, as well as any history of STIs.  Get tested for STIs and STDs (sexually transmitted diseases) before you have sex. Ask your partner to do the same.  Do not have sexual contact if you have symptoms of trichomoniasis or another STI.   Contact a health care provider if:  You still have symptoms after you finish your medicine.  You develop pain in your abdomen.  You have pain when you urinate.  You have bleeding after sex.  You develop a rash.  You feel nauseous or you vomit.  You plan  to become pregnant or think you may be pregnant. Summary  Trichomoniasis is an STI (sexually transmitted infection) that can affect both women and men.  This condition often has no symptoms (is asymptomatic), especially in men.  Without treatment, this condition can last for months or years.  You should not have sex until 7-10 days after you finish your medicine, or until your health care provider approves. Ask your health care provider when you may start to have sex again.  Discuss your infection with your sexual partner(s). Make sure that your partner gets tested and treated, if necessary. This information is not intended to replace advice given to you by your health care provider. Make sure you discuss any questions you have with your health care provider. Document Revised: 07/12/2018 Document Reviewed: 07/12/2018 Elsevier Patient Education  2021 [employer].

## 2021-02-24 LAB — HIV ANTIBODY (ROUTINE TESTING W REFLEX): HIV 1&2 Ab, 4th Generation: NONREACTIVE

## 2021-02-24 LAB — HEPATITIS C ANTIBODY
Hepatitis C Ab: NONREACTIVE
SIGNAL TO CUT-OFF: 0.01

## 2021-02-24 LAB — RPR: RPR Ser Ql: NONREACTIVE

## 2021-02-25 ENCOUNTER — Encounter: Payer: Self-pay | Admitting: Nurse Practitioner

## 2021-02-26 LAB — CYTOLOGY - PAP
Chlamydia: NEGATIVE
Comment: NEGATIVE
Comment: NEGATIVE
Comment: NORMAL
Diagnosis: NEGATIVE
High risk HPV: NEGATIVE
Neisseria Gonorrhea: NEGATIVE

## 2021-03-05 ENCOUNTER — Other Ambulatory Visit: Payer: 59

## 2021-04-09 ENCOUNTER — Ambulatory Visit (HOSPITAL_COMMUNITY)
Admission: EM | Admit: 2021-04-09 | Discharge: 2021-04-09 | Disposition: A | Discharge status: Home / Self Care | Payer: 59 | Attending: Medical Oncology | Admitting: Medical Oncology

## 2021-04-09 ENCOUNTER — Encounter (HOSPITAL_COMMUNITY): Payer: Self-pay

## 2021-04-09 ENCOUNTER — Other Ambulatory Visit: Payer: Self-pay

## 2021-04-09 DIAGNOSIS — Z3202 Encounter for pregnancy test, result negative: Secondary | ICD-10-CM | POA: Diagnosis not present

## 2021-04-09 DIAGNOSIS — J069 Acute upper respiratory infection, unspecified: Secondary | ICD-10-CM | POA: Diagnosis not present

## 2021-04-09 DIAGNOSIS — R1084 Generalized abdominal pain: Secondary | ICD-10-CM

## 2021-04-09 DIAGNOSIS — R059 Cough, unspecified: Secondary | ICD-10-CM | POA: Insufficient documentation

## 2021-04-09 DIAGNOSIS — Z20822 Contact with and (suspected) exposure to covid-19: Secondary | ICD-10-CM | POA: Diagnosis not present

## 2021-04-09 DIAGNOSIS — R109 Unspecified abdominal pain: Secondary | ICD-10-CM | POA: Insufficient documentation

## 2021-04-09 DIAGNOSIS — F17210 Nicotine dependence, cigarettes, uncomplicated: Secondary | ICD-10-CM | POA: Insufficient documentation

## 2021-04-09 LAB — POCT URINALYSIS DIPSTICK, ED / UC
Bilirubin Urine: NEGATIVE
Glucose, UA: NEGATIVE mg/dL
Hgb urine dipstick: NEGATIVE
Ketones, ur: NEGATIVE mg/dL
Leukocytes,Ua: NEGATIVE
Nitrite: NEGATIVE
Protein, ur: NEGATIVE mg/dL
Specific Gravity, Urine: 1.025 (ref 1.005–1.030)
Urobilinogen, UA: 0.2 mg/dL (ref 0.0–1.0)
pH: 7 (ref 5.0–8.0)

## 2021-04-09 LAB — POC URINE PREG, ED
Preg Test, Ur: NEGATIVE
Preg Test, Ur: NEGATIVE

## 2021-04-09 LAB — SARS CORONAVIRUS 2 (TAT 6-24 HRS): SARS Coronavirus 2: NEGATIVE

## 2021-04-09 MED ORDER — BENZONATATE 100 MG PO CAPS: 100.0000 mg | ORAL_CAPSULE | Freq: Three times a day (TID) | ORAL | 0 refills | Status: DC

## 2021-04-09 MED ORDER — FLUTICASONE PROPIONATE 50 MCG/ACT NA SUSP: 2.0000 | Freq: Every day | NASAL | 0 refills | Status: DC

## 2021-04-09 NOTE — ED Provider Notes (Addendum)
[facility]    CSN: [id number] Arrival date & time: 04/09/21  3614      History   Chief Complaint Chief Complaint  Patient presents with   Abdominal Pain   Back Pain   Headache   Fatigue    HPI [name] is a 45 y.o. female.   HPI  Abdominal Pain: Patient reports that she has had generalized lower abdominal pain, back aching, fatigue, headache, cough and congestion for the past 2 days. She is able to tolerate fluids and food. She has tried tylenol for symptoms without much relief. She questions if she may have a UTI or COVID-19. She denies vomiting, nausea, diarrhea, known fever, dysuria, chest pain, SOB.   Past Medical History:  Diagnosis Date   Cocaine use    Hypertension    Malignant hypertension    Nicotine dependence     Patient Active Problem List   Diagnosis Date Noted   Alcohol use disorder, severe, dependence (HCC) 01/23/2021   Malignant hypertension    Cocaine use    Nicotine dependence     Past Surgical History:  Procedure Laterality Date   CESAREAN SECTION     x3   TUBAL LIGATION      OB History     Gravida  7   Para  3   Term  3   Preterm      AB  4   Living  3      SAB  2   IAB  2   Ectopic      Multiple      Live Births               Home Medications    Prior to Admission medications   Medication Sig Start Date End Date Taking? Authorizing Provider  amLODipine (NORVASC) 5 MG tablet Take 1 tablet (5 mg total) by mouth daily. 01/23/21   Philip Aspen, Limmie Patricia, MD  metroNIDAZOLE (FLAGYL) 500 MG tablet Take 1 tablet (500 mg total) by mouth 2 (two) times daily. 02/21/21   Clarita Crane, NP  valsartan-hydrochlorothiazide (DIOVAN-HCT) 160-25 MG tablet Take 1 tablet by mouth daily. 01/23/21   Philip Aspen, Limmie Patricia, MD  potassium chloride (K-DUR) 10 MEQ tablet Take 3 tablets (30 mEq total) by mouth daily for 3 days. 08/08/18 03/19/20  Dietrich Pates, PA-C    Family History Family History  Problem  Relation Age of Onset   Cancer Father        throat and stomach   HIV Father    Breast cancer Paternal Grandmother    Hypertension Mother     Social History Social History   Tobacco Use   Smoking status: Every Day    Packs/day: 0.50    Pack years: 0.00    Types: Cigarettes   Smokeless tobacco: Never  Vaping Use   Vaping Use: Never used  Substance Use Topics   Alcohol use: Yes    Comment: on weekends only   Drug use: Not Currently    Types: Marijuana, Cocaine     Allergies   Motrin [ibuprofen]   Review of Systems Review of Systems  As stated above in HPI Physical Exam Triage Vital Signs ED Triage Vitals  Enc Vitals Group     BP 04/09/21 0836 (!) 176/104     Pulse Rate 04/09/21 0836 75     Resp 04/09/21 0836 18     Temp 04/09/21 0836 98.5 F (36.9 C)  Temp Source 04/09/21 0836 Oral     SpO2 04/09/21 0836 96 %     Weight --      Height --      Head Circumference --      Peak Flow --      Pain Score 04/09/21 0837 9     Pain Loc --      Pain Edu? --      Excl. in GC? --    No data found.  Updated Vital Signs BP (!) 176/104 (BP Location: Left Arm)   Pulse 75   Temp 98.5 F (36.9 C) (Oral)   Resp 18   LMP 02/23/2021   SpO2 96%   Physical Exam Vitals and nursing note reviewed.  Constitutional:      Appearance: She is well-developed.  HENT:     Head: Normocephalic and atraumatic.     Mouth/Throat:     Mouth: Mucous membranes are moist.     Pharynx: Pharyngeal swelling present. No oropharyngeal exudate.  Eyes:     Extraocular Movements: Extraocular movements intact.     Pupils: Pupils are equal, round, and reactive to light.  Cardiovascular:     Rate and Rhythm: Normal rate and regular rhythm.     Heart sounds: Normal heart sounds.  Pulmonary:     Effort: Pulmonary effort is normal.     Breath sounds: Normal breath sounds.  Abdominal:     General: Abdomen is flat. Bowel sounds are normal. There is no distension.     Palpations: Abdomen is  soft. There is no hepatomegaly, splenomegaly, mass or pulsatile mass.     Tenderness: There is no abdominal tenderness. There is no right CVA tenderness, left CVA tenderness, guarding or rebound. Negative signs include Murphy's sign and McBurney's sign.     Hernia: No hernia is present.  Skin:    General: Skin is warm.     Coloration: Skin is not jaundiced or pale.     Findings: No rash.  Neurological:     Mental Status: She is alert and oriented to person, place, and time.     UC Treatments / Results  Labs (all labs ordered are listed, but only abnormal results are displayed) Labs Reviewed - No data to display  EKG   Radiology No results found.  Procedures Procedures (including critical care time)  Medications Ordered in UC Medications - No data to display  Initial Impression / Assessment and Plan / UC Course  I have reviewed the triage vital signs and the nursing notes.  Pertinent labs & imaging results that were available during my care of the patient were reviewed by me and considered in my medical decision making (see chart for details).     New.  Likely viral in nature however we are going to assess her urine for any potential sign of urinary tract infection.  Update her urine pregnancy test is negative and her urinalysis does not show any sign of infection or other process.  Proceeding forward with COVID-19 testing.  Work note to be given.  Also sending her home with Flonase and Tessalon to help with her symptoms. Discussed the importance of rest and hydration. Follow up PRN.  Final Clinical Impressions(s) / UC Diagnoses   Final diagnoses:  None   Discharge Instructions   None    ED Prescriptions   None    PDMP not reviewed this encounter.   Karen Kitchens 04/09/21 0914    Rushie Chestnut, PA-C  04/09/21 0916    Rushie Chestnut, PA-C 04/16/21 2100    Rushie Chestnut, PA-C 04/16/21 2101

## 2021-04-09 NOTE — ED Triage Notes (Signed)
Pt c/o generalized lower, abdominal and back pain, fatigue, headache, congestion. No pain when urinating. Has been having hot flashes, pt is taking in fluids and food.  Started about two days ago.   Tylenol was not helping per patient.

## 2021-04-15 ENCOUNTER — Inpatient Hospital Stay: Admission: RE | Admit: 2021-04-15 | Payer: 59 | Source: Ambulatory Visit

## 2021-04-23 ENCOUNTER — Encounter: Payer: 59 | Admitting: Internal Medicine

## 2021-05-23 ENCOUNTER — Ambulatory Visit: Payer: 59 | Admitting: Nurse Practitioner

## 2021-11-24 ENCOUNTER — Other Ambulatory Visit: Payer: Self-pay | Admitting: Internal Medicine

## 2021-11-24 DIAGNOSIS — I10 Essential (primary) hypertension: Secondary | ICD-10-CM

## 2022-03-24 ENCOUNTER — Other Ambulatory Visit: Payer: Self-pay

## 2022-03-24 ENCOUNTER — Emergency Department (HOSPITAL_COMMUNITY): Payer: 59

## 2022-03-24 ENCOUNTER — Encounter (HOSPITAL_COMMUNITY): Payer: Self-pay | Admitting: Emergency Medicine

## 2022-03-24 ENCOUNTER — Emergency Department (HOSPITAL_COMMUNITY)
Admission: EM | Admit: 2022-03-24 | Discharge: 2022-03-24 | Disposition: A | Discharge status: Home / Self Care | Payer: 59 | Attending: Emergency Medicine | Admitting: Emergency Medicine

## 2022-03-24 DIAGNOSIS — Z91040 Latex allergy status: Secondary | ICD-10-CM | POA: Insufficient documentation

## 2022-03-24 DIAGNOSIS — Z79899 Other long term (current) drug therapy: Secondary | ICD-10-CM | POA: Insufficient documentation

## 2022-03-24 DIAGNOSIS — I10 Essential (primary) hypertension: Secondary | ICD-10-CM | POA: Insufficient documentation

## 2022-03-24 DIAGNOSIS — R519 Headache, unspecified: Secondary | ICD-10-CM | POA: Diagnosis present

## 2022-03-24 LAB — TROPONIN I (HIGH SENSITIVITY): Troponin I (High Sensitivity): 6 ng/L (ref <18)

## 2022-03-24 LAB — BASIC METABOLIC PANEL
Anion gap: 8 (ref 5–15)
BUN: 11 mg/dL (ref 6–20)
CO2: 27 mmol/L (ref 22–32)
Calcium: 9.5 mg/dL (ref 8.9–10.3)
Chloride: 103 mmol/L (ref 98–111)
Creatinine, Ser: 0.96 mg/dL (ref 0.44–1.00)
GFR, Estimated: >60 mL/min (ref >60)
Glucose, Bld: 87 mg/dL (ref 70–99)
Potassium: 3.7 mmol/L (ref 3.5–5.1)
Sodium: 138 mmol/L (ref 135–145)

## 2022-03-24 LAB — CBC WITH DIFFERENTIAL/PLATELET
Abs Immature Granulocytes: 0.02 10*3/uL (ref 0.00–0.07)
Basophils Absolute: 0 10*3/uL (ref 0.0–0.1)
Basophils Relative: 1 %
Eosinophils Absolute: 0.1 10*3/uL (ref 0.0–0.5)
Eosinophils Relative: 2 %
HCT: 37.6 % (ref 36.0–46.0)
Hemoglobin: 12.9 g/dL (ref 12.0–15.0)
Immature Granulocytes: 0 %
Lymphocytes Relative: 28 %
Lymphs Abs: 1.7 10*3/uL (ref 0.7–4.0)
MCH: 33.3 pg (ref 26.0–34.0)
MCHC: 34.3 g/dL (ref 30.0–36.0)
MCV: 97.2 fL (ref 80.0–100.0)
Monocytes Absolute: 0.4 10*3/uL (ref 0.1–1.0)
Monocytes Relative: 6 %
Neutro Abs: 3.8 10*3/uL (ref 1.7–7.7)
Neutrophils Relative %: 63 %
Platelets: 279 10*3/uL (ref 150–400)
RBC: 3.87 MIL/uL (ref 3.87–5.11)
RDW: 13.1 % (ref 11.5–15.5)
WBC: 6 10*3/uL (ref 4.0–10.5)
nRBC: 0 % (ref 0.0–0.2)

## 2022-03-24 MED ORDER — HYDRALAZINE HCL 25 MG PO TABS
25.0000 mg | ORAL_TABLET | Freq: Once | ORAL | Status: AC
  Administered 2022-03-24: 25 mg via ORAL
  Filled 2022-03-24: qty 1

## 2022-03-24 MED ORDER — AMLODIPINE BESYLATE 5 MG PO TABS: ORAL_TABLET | ORAL | 0 refills | Status: DC

## 2022-03-24 MED ORDER — KETOROLAC TROMETHAMINE 30 MG/ML IJ SOLN
30.0000 mg | Freq: Once | INTRAMUSCULAR | Status: AC
  Administered 2022-03-24: 30 mg via INTRAMUSCULAR
  Filled 2022-03-24: qty 1

## 2022-03-24 MED ORDER — VALSARTAN-HYDROCHLOROTHIAZIDE 160-25 MG PO TABS: 1.0000 | ORAL_TABLET | Freq: Every day | ORAL | 0 refills | Status: DC

## 2022-03-24 MED ORDER — CLONIDINE HCL 0.2 MG PO TABS: 0.2000 mg | ORAL_TABLET | Freq: Once | ORAL | Status: DC

## 2022-03-24 MED ORDER — AMLODIPINE BESYLATE 5 MG PO TABS
5.0000 mg | ORAL_TABLET | Freq: Once | ORAL | Status: AC
  Administered 2022-03-24: 5 mg via ORAL
  Filled 2022-03-24: qty 1

## 2022-03-24 NOTE — ED Provider Triage Note (Addendum)
Emergency Medicine Provider Triage Evaluation Note  [name] , a 46 y.o. female  was evaluated in triage.  Patient has a past medical history of hypertension and is presenting with a headache.  She says that it started acutely this morning.  She checked her blood pressure then and it was 205/138.  A friend gave her 0.1 mg of clonidine at that time.  At 925 her blood pressure was 184/131.  She had 5 mg of Norvasc at that time.  At 10 AM her blood pressure was 188/131.  She had another 0.1 of clonidine at that time.  She continues to have a headache.  Said that her vision was blurred but it is no longer blurred.  Has not taken her antihypertensives and over 6 months.  Per chart review she is on amlodipine and Diovan  Review of Systems  Positive: Headache, blurred vision Negative: Chest pain, back pain  Physical Exam  BP (!) 197/121 (BP Location: Right Arm)   Pulse 79   Temp 98.5 F (36.9 C) (Oral)   Resp 16   SpO2 100%  Gen:   Awake, no distress   Resp:  Normal effort  MSK:   Moves extremities without difficulty  Other:  PERRLA but slow to react.  RRR.  Cranial nerves II through XII grossly intact.  Moving all extremities, no pronator drift.  Smile equal.  5 out of 5 strength to bilateral upper lip.  Medical Decision Making  Medically screening exam initiated at 10:47 AM.  Appropriate orders placed.  [name] was informed that the remainder of the evaluation will be completed by another provider, this initial triage assessment does not replace that evaluation, and the importance of remaining in the ED until their evaluation is complete.    Nursing notified that patient needs the next room.  Patient has listed cocaine use in the chart but she denies this.  Does state that she drinks coffee daily.  We will give hydralazine in triage being that patient is already had 0.2 mg of clonidine in the past 1.5 hours.   [name] 03/24/22 1052    Patient has  previously listed cocaine use however she says she no longer uses   [name], [place], PA-C 03/24/22 1055

## 2022-03-24 NOTE — Discharge Instructions (Addendum)
Your workup today was unremarkable and no abnormal findings. Blood pressure medications refilled today.  Take as prescribed.  Monitor your blood pressure, record readings and take to PCP for follow-up.  Return to the emergency room at anytime for worsening or concerning symptoms.

## 2022-03-24 NOTE — ED Provider Notes (Signed)
[facility] EMERGENCY DEPARTMENT Provider Note   CSN: [id number] Arrival date & time: 03/24/22  1036     History  Chief Complaint  Patient presents with   Hypertension   Headache    [name] is a 46 y.o. female.  46 year old female with past medical history of hypertension presents with complaint of feeling poorly today while at work.  States that she started to feel lightheaded, dizzy with a headache, her blood pressure was checked at work and found to have a systolic of [id number].  Patient arrived in the department hypertensive, was given hydralazine and labs ordered.  Reports that she is prescribed blood pressure medication and has a PCP however does not have her medication and does not have refills available, has not taken her medication in a long time.  History on file of prior cocaine use, states that she has not used cocaine in several years.  Reports mild chest discomfort denies associated shortness of breath, denies weakness or numbness.  No other complaints or concerns at this time.       Home Medications Prior to Admission medications   Medication Sig Start Date End Date Taking? Authorizing Provider  amLODipine (NORVASC) 5 MG tablet TAKE 1 TABLET(5 MG) BY MOUTH DAILY 03/24/22   Suella Broad A, PA-C  valsartan-hydrochlorothiazide (DIOVAN-HCT) 160-25 MG tablet Take 1 tablet by mouth daily. 03/24/22 04/23/22  Suella Broad A, PA-C  potassium chloride (K-DUR) 10 MEQ tablet Take 3 tablets (30 mEq total) by mouth daily for 3 days. 08/08/18 03/19/20  Khatri, Hina, PA-C      Allergies    Motrin [ibuprofen] and Latex    Review of Systems   Review of Systems Negative except as per HPI Physical Exam Updated Vital Signs BP (!) 141/95   Pulse 64   Temp 98.5 F (36.9 C) (Oral)   Resp 19   SpO2 98%  Physical Exam Vitals and nursing note reviewed.  Constitutional:      General: She is not in acute distress.    Appearance: She is well-developed. She is not  diaphoretic.  HENT:     Head: Normocephalic and atraumatic.     Mouth/Throat:     Mouth: Mucous membranes are moist.  Eyes:     Extraocular Movements: Extraocular movements intact.     Pupils: Pupils are equal, round, and reactive to light.  Cardiovascular:     Rate and Rhythm: Normal rate and regular rhythm.     Heart sounds: Normal heart sounds.  Pulmonary:     Effort: Pulmonary effort is normal.     Breath sounds: Normal breath sounds.  Abdominal:     Palpations: Abdomen is soft.     Tenderness: There is no abdominal tenderness.  Musculoskeletal:        General: No swelling or tenderness.     Cervical back: Neck supple.     Right lower leg: No edema.     Left lower leg: No edema.  Skin:    General: Skin is warm and dry.  Neurological:     Mental Status: She is alert and oriented to person, place, and time.     GCS: GCS eye subscore is 4. GCS verbal subscore is 5. GCS motor subscore is 6.  Psychiatric:        Behavior: Behavior normal.     ED Results / Procedures / Treatments   Labs (all labs ordered are listed, but only abnormal results are displayed) Labs Reviewed  CBC  WITH DIFFERENTIAL/PLATELET  BASIC METABOLIC PANEL  TROPONIN I (HIGH SENSITIVITY)  TROPONIN I (HIGH SENSITIVITY)    EKG None  Radiology CT Head Wo Contrast  Result Date: 03/24/2022 CLINICAL DATA:  Possible stroke. EXAM: CT HEAD WITHOUT CONTRAST TECHNIQUE: Contiguous axial images were obtained from the base of the skull through the vertex without intravenous contrast. RADIATION DOSE REDUCTION: This exam was performed according to the departmental dose-optimization program which includes automated exposure control, adjustment of the mA and/or kV according to patient size and/or use of iterative reconstruction technique. COMPARISON:  June 18, 2017. FINDINGS: Brain: No evidence of acute infarction, hemorrhage, hydrocephalus, extra-axial collection or mass lesion/mass effect. Vascular: No hyperdense  vessel or unexpected calcification. Skull: Normal. Negative for fracture or focal lesion. Sinuses/Orbits: No acute finding. Other: None. IMPRESSION: No acute intracranial abnormality seen. Electronically Signed   By: Marijo Conception M.D.   On: 03/24/2022 11:29    Procedures Procedures    Medications Ordered in ED Medications  hydrALAZINE (APRESOLINE) tablet 25 mg (25 mg Oral Given 03/24/22 1048)  amLODipine (NORVASC) tablet 5 mg (5 mg Oral Given 03/24/22 1347)    ED Course/ Medical Decision Making/ A&P Clinical Course as of 03/24/22 1511  Tue Mar 24, 2022  1510 HTN + chst discomfort. Hydralazine in triage, improved but got worse again. Now has home meds. Waiting troponin. [GL]    Clinical Course User Index [GL] Adolphus Birchwood, PA-C                           Medical Decision Making Risk Prescription drug management.   This patient presents to the ED for concern of dizziness, chest discomfort, hypertension, this involves an extensive number of treatment options, and is a complaint that carries with it a high risk of complications and morbidity.  The differential diagnosis includes but not limited to ACS, CVA, HTN   Co morbidities that complicate the patient evaluation  Uncontrolled HTN   Additional history obtained:  External records from outside source obtained and reviewed including 02/11/21 visit to PCP, on Amlodipine 5gm and Diovan HCT 160/25   Lab Tests:  I Ordered, and personally interpreted labs.  The pertinent results include:  CBC and BMP WNL, add on troponin for CP which is pending at time of signout   Imaging Studies ordered:  I ordered imaging studies including CT head  I independently visualized and interpreted imaging which showed no acute findings I agree with the radiologist interpretation   Cardiac Monitoring: / EKG:  The patient was maintained on a cardiac monitor.  I personally viewed and interpreted the cardiac monitored which showed an underlying  rhythm of: Normal sinus rhythm, rate 72  Problem List / ED Course / Critical interventions / Medication management  46 year old female with complaint of elevated blood pressure and feeling poorly today.  Patient is hypertensive on arrival with blood pressure of 187/121, given hydralazine in triage with improvement of blood pressure 145/98.  After extensive weight, recheck blood pressure in room of 160/95.  I have ordered patient's 5 mg Norvasc.  Labs reviewed and reassuring, CT head reassuring.  Will add on troponin for complaint of chest discomfort with her hypertension, EKG without ischemic changes. Plan is to likely discharge to follow-up with PCP, refill blood pressure medications, monitor blood pressure record readings at home. I ordered medication including Norvasc for hypertension Reevaluation of the patient after these medicines showed that the patient improved I have reviewed the  patients home medicines and have made adjustments as needed   Social Determinants of Health:  Has PCP, non compliant with BP meds   Test / Admission - Considered:  Admission is considered however blood pressure has improved, symptoms improved, labs so far reassuring pending troponin at time of signout.         Final Clinical Impression(s) / ED Diagnoses Final diagnoses:  Hypertension, unspecified type    Rx / DC Orders ED Discharge Orders          Ordered    amLODipine (NORVASC) 5 MG tablet       Note to Pharmacy: Patient needs an appt   03/24/22 1410    valsartan-hydrochlorothiazide (DIOVAN-HCT) 160-25 MG tablet  Daily        03/24/22 1410              Tacy Learn, PA-C 03/24/22 1512    Regan Lemming, MD 03/24/22 1526

## 2022-03-24 NOTE — ED Provider Notes (Signed)
Accepted handoff at shift change from [place], [place]. Please see prior provider note for more detail.   Briefly: Patient is 46 y.o. female who presented today for lightheaded, dizzy, and had associated headache.  She measured her blood pressure at work and was found to have a systolic blood pressure of 220.Patient arrived in the department hypertensive, was given hydralazine and labs ordered.  Reports that she is prescribed blood pressure medication and has a PCP however does not have her medication and does not have refills available, has not taken her medication in a long time.  History on file of prior cocaine use, states that she has not used cocaine in several years.  Reports mild chest discomfort denies associated shortness of breath, denies weakness or numbness.  No other complaints or concerns at this time.  Per prior provider, patient was having some mild chest discomfort. Cardiac markers were ordered.  Also given her home amlodipine and her blood pressures have improved.  She has been maintaining blood pressures around 140/90.  Symptoms have also improved.  Lab work so far is returned with a normal CBC and normal BMP.  She has had a unremarkable CT head as well as an unchanged from baseline EKG.  We are waiting troponin markers.  DDX: concern for hypertensive emergency  Plan: If initial troponin is negative, plan for discharge.   Physical Exam  BP (!) 169/112   Pulse 67   Temp 98.5 F (36.9 C) (Oral)   Resp 19   SpO2 100%   Physical Exam  Procedures  Procedures  ED Course / MDM      Medical Decision Making Risk Prescription drug management.   Troponin is 6.  She does not require any further troponins as chest pain onset was > 3 hours prior to initial draw. She is also asymptomatic now.  Patient was given Toradol for a headache.  I did review all work-up and imaging.  She had a normal head CT, negative Trop, normal CBC and BMP.  Her blood pressure has improved after  hydralazine and Norvasc in the ED. She does not meet requirements for admission at this time.  She will be sent home with a refill of her amlodipine and Diovan.  She should f/u with PCP and provided with return precautions.       Sheila Oats 03/24/22 1705    Lucrezia Starch, MD 03/24/22 2001

## 2022-03-24 NOTE — ED Triage Notes (Signed)
Pt states she woke up and felt normal- went to work. While on her breakfast break at 0830 she started to have a really bad headache, and felt dizzy. Pt has not taken her BP meds in about 6 months. They checked her BP at work and her systolic was 220's.

## 2022-03-25 ENCOUNTER — Telehealth: Payer: Self-pay

## 2022-03-25 NOTE — Telephone Encounter (Signed)
Transition Care Management Unsuccessful Follow-up Telephone Call  Date of discharge and from where:  03/24/22 from [facility] ED  Attempts:  1st Attempt  Reason for unsuccessful TCM follow-up call:  Left voice message  If pt calls back she needs CPE or ED f/u scheduled ASAP. Last OV 02/11/21

## 2022-03-26 ENCOUNTER — Encounter: Payer: Self-pay | Admitting: *Deleted

## 2022-05-21 ENCOUNTER — Telehealth: Payer: 59 | Admitting: Physician Assistant

## 2022-05-21 DIAGNOSIS — R232 Flushing: Secondary | ICD-10-CM | POA: Diagnosis not present

## 2022-05-21 NOTE — Progress Notes (Signed)
Virtual Visit Consent   [name], you are scheduled for a virtual visit with a [facility] provider today. Just as with appointments in the office, your consent must be obtained to participate. Your consent will be active for this visit and any virtual visit you may have with one of our providers in the next 365 days. If you have a MyChart account, a copy of this consent can be sent to you electronically.  As this is a virtual visit, video technology does not allow for your provider to perform a traditional examination. This may limit your provider's ability to fully assess your condition. If your provider identifies any concerns that need to be evaluated in person or the need to arrange testing (such as labs, EKG, etc.), we will make arrangements to do so. Although advances in technology are sophisticated, we cannot ensure that it will always work on either your end or our end. If the connection with a video visit is poor, the visit may have to be switched to a telephone visit. With either a video or telephone visit, we are not always able to ensure that we have a secure connection.  By engaging in this virtual visit, you consent to the provision of healthcare and authorize for your insurance to be billed (if applicable) for the services provided during this visit. Depending on your insurance coverage, you may receive a charge related to this service.  I need to obtain your verbal consent now. Are you willing to proceed with your visit today? [name] has provided verbal consent on 05/21/2022 for a virtual visit (video or telephone). Piedad Climes, [place]  Date: 05/21/2022 1:38 PM  Virtual Visit via Video Note   I, Piedad Climes, connected with  [name]  ([id number], 44-Nov-1977) on 05/21/22 at  1:15 PM EDT by a video-enabled telemedicine application and verified that I am speaking with the correct person using two identifiers.  Location: Patient: Virtual Visit Location  Patient: Other: Work Provider: Pharmacist, community: Home Office   I discussed the limitations of evaluation and management by telemedicine and the availability of in person appointments. The patient expressed understanding and agreed to proceed.    History of Present Illness: [name] is a 46 y.o. who identifies as a female who was assigned female at birth, and is being seen today for hot flashes she has been experiencing the past few weeks. Notes can happen any time of the day, starts in her legs and moves up her body live a wave. Denies any fevers. Sometimes wakes her up from sleep, even though she keeps it very cool in her room. Denies any recent change to diet or hydration. Denies increased stress levels. Continues to smoke daily. Does drink but denies any significant alcohol consumption at present, despite documented history of severe alcohol dependence in chart. Has history of HTN for which she is prescribed amlodipine 5 mg daily and Diovan-HCT 160-25 mg daily. Has not been taking regularly and has not been checking her BP levels. Does not know how old mom, grandmother, aunts were at time of menopause. Over the past few months is noting increasing irregularity with her menstrual periods.  HPI: HPI  Problems:  Patient Active Problem List   Diagnosis Date Noted   Alcohol use disorder, severe, dependence (HCC) 01/23/2021   Malignant hypertension    Cocaine use    Nicotine dependence     Allergies:  Allergies  Allergen Reactions  Motrin [Ibuprofen] Nausea Only and Other (See Comments)    Stomach pain   Latex Itching and Rash   Medications:  Current Outpatient Medications:    amLODipine (NORVASC) 5 MG tablet, TAKE 1 TABLET(5 MG) BY MOUTH DAILY, Disp: 30 tablet, Rfl: 0   valsartan-hydrochlorothiazide (DIOVAN-HCT) 160-25 MG tablet, Take 1 tablet by mouth daily., Disp: 30 tablet, Rfl: 0  Observations/Objective: Patient is well-developed, well-nourished in no acute  distress.  Resting comfortably.  Head is normocephalic, atraumatic.  No labored breathing.  Speech is clear and coherent with logical content.  Patient is alert and oriented at baseline.   Assessment and Plan: 1. Hot flashes  Most likely perimenopausal but needs further evaluation. She is to minimize alcohol and nicotine. Take BP medications as directed and check BP at home to record and take to PCP follow-up. Recommend black  cohosh and I-cool OTC. She is aware we do not start any long-term medications as this is a digital urgent care practice. May benefit from SNRI or other therapy if found to be truly perimenopausal. She is calling her PCP after this visit to schedule follow-up.  Follow Up Instructions: I discussed the assessment and treatment plan with the patient. The patient was provided an opportunity to ask questions and all were answered. The patient agreed with the plan and demonstrated an understanding of the instructions.  A copy of instructions were sent to the patient via MyChart unless otherwise noted below.    The patient was advised to call back or seek an in-person evaluation if the symptoms worsen or if the condition fails to improve as anticipated.  Time:  I spent 12 minutes with the patient via telehealth technology discussing the above problems/concerns.    Piedad Climes, PA-C

## 2022-05-21 NOTE — Patient Instructions (Signed)
[name], thank you for joining Leeanne Rio, PA-C for today's virtual visit.  While this provider is not your primary care provider (PCP), if your PCP is located in our provider database this encounter information will be shared with them immediately following your visit.  Consent: (Patient) [name] provided verbal consent for this virtual visit at the beginning of the encounter.  Current Medications:  Current Outpatient Medications:    amLODipine (NORVASC) 5 MG tablet, TAKE 1 TABLET(5 MG) BY MOUTH DAILY, Disp: 30 tablet, Rfl: 0   valsartan-hydrochlorothiazide (DIOVAN-HCT) 160-25 MG tablet, Take 1 tablet by mouth daily., Disp: 30 tablet, Rfl: 0   Medications ordered in this encounter:  No orders of the defined types were placed in this encounter.    *If you need refills on other medications prior to your next appointment, please contact your pharmacy*  Follow-Up: Call back or seek an in-person evaluation if the symptoms worsen or if the condition fails to improve as anticipated.  Other Instructions Please avoid alcohol and limit caffeine, tobacco use and spicy foods. Keep hydrated. Start OTC I-cool or Black Cohosh supplement daily until you can get in with your PCP. Make sure to take your BP medications as directed, without fail, and restart checking BP every couple of days at home so we can make sure BP is being truly controlled.   DASH Eating Plan DASH stands for Dietary Approaches to Stop Hypertension. The DASH eating plan is a healthy eating plan that has been shown to: Reduce high blood pressure (hypertension). Reduce your risk for type 2 diabetes, heart disease, and stroke. Help with weight loss. What are tips for following this plan? Reading food labels Check food labels for the amount of salt (sodium) per serving. Choose foods with less than 5 percent of the Daily Value of sodium. Generally, foods with less than 300 milligrams (mg) of sodium per  serving fit into this eating plan. To find whole grains, look for the word "whole" as the first word in the ingredient list. Shopping Buy products labeled as "low-sodium" or "no salt added." Buy fresh foods. Avoid canned foods and pre-made or frozen meals. Cooking Avoid adding salt when cooking. Use salt-free seasonings or herbs instead of table salt or sea salt. Check with your health care provider or pharmacist before using salt substitutes. Do not fry foods. Cook foods using healthy methods such as baking, boiling, grilling, roasting, and broiling instead. Cook with heart-healthy oils, such as olive, canola, avocado, soybean, or sunflower oil. Meal planning  Eat a balanced diet that includes: 4 or more servings of fruits and 4 or more servings of vegetables each day. Try to fill one-half of your plate with fruits and vegetables. 6-8 servings of whole grains each day. Less than 6 oz (170 g) of lean meat, poultry, or fish each day. A 3-oz (85-g) serving of meat is about the same size as a deck of cards. One egg equals 1 oz (28 g). 2-3 servings of low-fat dairy each day. One serving is 1 cup (237 mL). 1 serving of nuts, seeds, or beans 5 times each week. 2-3 servings of heart-healthy fats. Healthy fats called omega-3 fatty acids are found in foods such as walnuts, flaxseeds, fortified milks, and eggs. These fats are also found in cold-water fish, such as sardines, salmon, and mackerel. Limit how much you eat of: Canned or prepackaged foods. Food that is high in trans fat, such as some fried foods. Food that is high in  saturated fat, such as fatty meat. Desserts and other sweets, sugary drinks, and other foods with added sugar. Full-fat dairy products. Do not salt foods before eating. Do not eat more than 4 egg yolks a week. Try to eat at least 2 vegetarian meals a week. Eat more home-cooked food and less restaurant, buffet, and fast food. Lifestyle When eating at a restaurant, ask that  your food be prepared with less salt or no salt, if possible. If you drink alcohol: Limit how much you use to: 0-1 drink a day for women who are not pregnant. 0-2 drinks a day for men. Be aware of how much alcohol is in your drink. In the [place] one drink equals one 12 oz bottle of beer (355 mL), one 5 oz glass of wine (148 mL), or one 1 oz glass of hard liquor (44 mL). General information Avoid eating more than 2,300 mg of salt a day. If you have hypertension, you may need to reduce your sodium intake to 1,500 mg a day. Work with your health care provider to maintain a healthy body weight or to lose weight. Ask what an ideal weight is for you. Get at least 30 minutes of exercise that causes your heart to beat faster (aerobic exercise) most days of the week. Activities may include walking, swimming, or biking. Work with your health care provider or dietitian to adjust your eating plan to your individual calorie needs. What foods should I eat? Fruits All fresh, dried, or frozen fruit. Canned fruit in natural juice (without added sugar). Vegetables Fresh or frozen vegetables (raw, steamed, roasted, or grilled). Low-sodium or reduced-sodium tomato and vegetable juice. Low-sodium or reduced-sodium tomato sauce and tomato paste. Low-sodium or reduced-sodium canned vegetables. Grains Whole-grain or whole-wheat bread. Whole-grain or whole-wheat pasta. Brown rice. Orpah Cobb. Bulgur. Whole-grain and low-sodium cereals. Pita bread. Low-fat, low-sodium crackers. Whole-wheat flour tortillas. Meats and other proteins Skinless chicken or [place]. Ground chicken or [place]. Pork with fat trimmed off. Fish and seafood. Egg whites. Dried beans, peas, or lentils. Unsalted nuts, nut butters, and seeds. Unsalted canned beans. Lean cuts of beef with fat trimmed off. Low-sodium, lean precooked or cured meat, such as sausages or meat loaves. Dairy Low-fat (1%) or fat-free (skim) milk. Reduced-fat, low-fat, or  fat-free cheeses. Nonfat, low-sodium ricotta or cottage cheese. Low-fat or nonfat yogurt. Low-fat, low-sodium cheese. Fats and oils Soft margarine without trans fats. Vegetable oil. Reduced-fat, low-fat, or light mayonnaise and salad dressings (reduced-sodium). Canola, safflower, olive, avocado, soybean, and sunflower oils. Avocado. Seasonings and condiments Herbs. Spices. Seasoning mixes without salt. Other foods Unsalted popcorn and pretzels. Fat-free sweets. The items listed above may not be a complete list of foods and beverages you can eat. Contact a dietitian for more information. What foods should I avoid? Fruits Canned fruit in a light or heavy syrup. Fried fruit. Fruit in cream or butter sauce. Vegetables Creamed or fried vegetables. Vegetables in a cheese sauce. Regular canned vegetables (not low-sodium or reduced-sodium). Regular canned tomato sauce and paste (not low-sodium or reduced-sodium). Regular tomato and vegetable juice (not low-sodium or reduced-sodium). Rosita Fire. Olives. Grains Baked goods made with fat, such as croissants, muffins, or some breads. Dry pasta or rice meal packs. Meats and other proteins Fatty cuts of meat. Ribs. Fried meat. Tomasa Blase. Bologna, salami, and other precooked or cured meats, such as sausages or meat loaves. Fat from the back of a pig (fatback). Bratwurst. Salted nuts and seeds. Canned beans with added salt. Canned or smoked fish. Whole  eggs or egg yolks. Chicken or [place] with skin. Dairy Whole or 2% milk, cream, and half-and-half. Whole or full-fat cream cheese. Whole-fat or sweetened yogurt. Full-fat cheese. Nondairy creamers. Whipped toppings. Processed cheese and cheese spreads. Fats and oils Butter. Stick margarine. Lard. Shortening. Ghee. Bacon fat. Tropical oils, such as coconut, palm kernel, or palm oil. Seasonings and condiments Onion salt, garlic salt, seasoned salt, table salt, and sea salt. Worcestershire sauce. Tartar sauce. Barbecue  sauce. Teriyaki sauce. Soy sauce, including reduced-sodium. Steak sauce. Canned and packaged gravies. Fish sauce. Oyster sauce. Cocktail sauce. Store-bought horseradish. Ketchup. Mustard. Meat flavorings and tenderizers. Bouillon cubes. Hot sauces. Pre-made or packaged marinades. Pre-made or packaged taco seasonings. Relishes. Regular salad dressings. Other foods Salted popcorn and pretzels. The items listed above may not be a complete list of foods and beverages you should avoid. Contact a dietitian for more information. Where to find more information National Heart, Lung, and Blood Institute: PopSteam.is American Heart Association: www.heart.org Academy of Nutrition and Dietetics: www.eatright.org National Kidney Foundation: www.kidney.org Summary The DASH eating plan is a healthy eating plan that has been shown to reduce high blood pressure (hypertension). It may also reduce your risk for type 2 diabetes, heart disease, and stroke. When on the DASH eating plan, aim to eat more fresh fruits and vegetables, whole grains, lean proteins, low-fat dairy, and heart-healthy fats. With the DASH eating plan, you should limit salt (sodium) intake to 2,300 mg a day. If you have hypertension, you may need to reduce your sodium intake to 1,500 mg a day. Work with your health care provider or dietitian to adjust your eating plan to your individual calorie needs. This information is not intended to replace advice given to you by your health care provider. Make sure you discuss any questions you have with your health care provider. Document Revised: 09/01/2019 Document Reviewed: 09/01/2019 Elsevier Patient Education  2023 Elsevier Inc.    If you have been instructed to have an in-person evaluation today at a local Urgent Care facility, please use the link below. It will take you to a list of all of our available [facility], including address, phone number and hours of operation. Please do  not delay care.  [facility]  If you or a family member do not have a primary care provider, use the link below to schedule a visit and establish care. When you choose a [facility] primary care physician or advanced practice provider, you gain a long-term partner in health. Find a Primary Care Provider  Learn more about [facility]'s in-office and virtual care options: [facility] - Get Care Now

## 2022-05-25 ENCOUNTER — Ambulatory Visit: Payer: 59 | Admitting: Family Medicine

## 2022-06-23 ENCOUNTER — Ambulatory Visit (INDEPENDENT_AMBULATORY_CARE_PROVIDER_SITE_OTHER): Payer: 59 | Admitting: Internal Medicine

## 2022-06-23 ENCOUNTER — Encounter: Payer: Self-pay | Admitting: Internal Medicine

## 2022-06-23 VITALS — BP 182/90 | HR 64 | Temp 98.3°F | Wt 181.5 lb

## 2022-06-23 DIAGNOSIS — I10 Essential (primary) hypertension: Secondary | ICD-10-CM | POA: Diagnosis not present

## 2022-06-23 LAB — COMPREHENSIVE METABOLIC PANEL
ALT: 25 U/L (ref 0–35)
AST: 21 U/L (ref 0–37)
Albumin: 4.4 g/dL (ref 3.5–5.2)
Alkaline Phosphatase: 54 U/L (ref 39–117)
BUN: 15 mg/dL (ref 6–23)
CO2: 29 mEq/L (ref 19–32)
Calcium: 9.5 mg/dL (ref 8.4–10.5)
Chloride: 102 mEq/L (ref 96–112)
Creatinine, Ser: 0.73 mg/dL (ref 0.40–1.20)
GFR: 100.11 mL/min (ref >60.00)
Glucose, Bld: 79 mg/dL (ref 70–99)
Potassium: 3.7 mEq/L (ref 3.5–5.1)
Sodium: 138 mEq/L (ref 135–145)
Total Bilirubin: 0.3 mg/dL (ref 0.2–1.2)
Total Protein: 7.9 g/dL (ref 6.0–8.3)

## 2022-06-23 LAB — CBC WITH DIFFERENTIAL/PLATELET
Basophils Absolute: 0.1 10*3/uL (ref 0.0–0.1)
Basophils Relative: 1.7 % (ref 0.0–3.0)
Eosinophils Absolute: 0.1 10*3/uL (ref 0.0–0.7)
Eosinophils Relative: 1.2 % (ref 0.0–5.0)
HCT: 38.8 % (ref 36.0–46.0)
Hemoglobin: 13 g/dL (ref 12.0–15.0)
Lymphocytes Relative: 29.7 % (ref 12.0–46.0)
Lymphs Abs: 2.1 10*3/uL (ref 0.7–4.0)
MCHC: 33.5 g/dL (ref 30.0–36.0)
MCV: 96.1 fl (ref 78.0–100.0)
Monocytes Absolute: 0.5 10*3/uL (ref 0.1–1.0)
Monocytes Relative: 7.7 % (ref 3.0–12.0)
Neutro Abs: 4.2 10*3/uL (ref 1.4–7.7)
Neutrophils Relative %: 59.7 % (ref 43.0–77.0)
Platelets: 281 10*3/uL (ref 150.0–400.0)
RBC: 4.03 Mil/uL (ref 3.87–5.11)
RDW: 13.5 % (ref 11.5–15.5)
WBC: 7 10*3/uL (ref 4.0–10.5)

## 2022-06-23 LAB — LIPID PANEL
Cholesterol: 247 mg/dL — ABNORMAL HIGH (ref 0–200)
HDL: 74.4 mg/dL (ref >39.00)
LDL Cholesterol: 152 mg/dL — ABNORMAL HIGH (ref 0–99)
NonHDL: 172.33
Total CHOL/HDL Ratio: 3
Triglycerides: 103 mg/dL (ref 0.0–149.0)
VLDL: 20.6 mg/dL (ref 0.0–40.0)

## 2022-06-23 LAB — TSH: TSH: 1.04 u[IU]/mL (ref 0.35–5.50)

## 2022-06-23 MED ORDER — AMLODIPINE BESYLATE 5 MG PO TABS: ORAL_TABLET | ORAL | 1 refills | Status: DC

## 2022-06-23 MED ORDER — VALSARTAN-HYDROCHLOROTHIAZIDE 160-25 MG PO TABS: 1.0000 | ORAL_TABLET | Freq: Every day | ORAL | 1 refills | Status: DC

## 2022-06-23 NOTE — Progress Notes (Signed)
Established Patient Office Visit     CC/Reason for Visit: Discuss blood pressure  HPI: [name] is a 46 y.o. female who is coming in today for the above mentioned reasons.  I have not seen her since May 2021.  At that time she was on amlodipine 5 mg and Diovan HCT 160/25 mg.  She has not been taking medication in a long time.  She has been feeling an extreme sense of anxiety, not sleeping well and headaches.  Blood pressure in office today is 190/120 and 182/100.  She has not had any focal neurologic deficits, chest pain, dyspnea on exertion or shortness of breath.   Past Medical/Surgical History: Past Medical History:  Diagnosis Date   Cocaine use    Hypertension    Malignant hypertension    Nicotine dependence     Past Surgical History:  Procedure Laterality Date   CESAREAN SECTION     x3   TUBAL LIGATION      Social History:  reports that she has been smoking cigarettes. She has been smoking an average of .5 packs per day. She has never used smokeless tobacco. She reports current alcohol use. She reports that she does not currently use drugs after having used the following drugs: Marijuana and Cocaine.  Allergies: Allergies  Allergen Reactions   Motrin [Ibuprofen] Nausea Only and Other (See Comments)    Stomach pain   Latex Itching and Rash    Family History:  Family History  Problem Relation Age of Onset   Cancer Father        throat and stomach   HIV Father    Breast cancer Paternal Grandmother    Hypertension Mother      Current Outpatient Medications:    amLODipine (NORVASC) 5 MG tablet, TAKE 1 TABLET(5 MG) BY MOUTH DAILY, Disp: 90 tablet, Rfl: 1   valsartan-hydrochlorothiazide (DIOVAN-HCT) 160-25 MG tablet, Take 1 tablet by mouth daily., Disp: 90 tablet, Rfl: 1  Review of Systems:  Constitutional: Denies fever, chills, diaphoresis, appetite change and fatigue.  HEENT: Denies photophobia, eye pain, redness, hearing loss, ear pain,  congestion, sore throat, rhinorrhea, sneezing, mouth sores, trouble swallowing, neck pain, neck stiffness and tinnitus.   Respiratory: Denies SOB, DOE, cough, chest tightness,  and wheezing.   Cardiovascular: Denies chest pain, palpitations and leg swelling.  Gastrointestinal: Denies nausea, vomiting, abdominal pain, diarrhea, constipation, blood in stool and abdominal distention.  Genitourinary: Denies dysuria, urgency, frequency, hematuria, flank pain and difficulty urinating.  Endocrine: Denies: hot or cold intolerance, sweats, changes in hair or nails, polyuria, polydipsia. Musculoskeletal: Denies myalgias, back pain, joint swelling, arthralgias and gait problem.  Skin: Denies pallor, rash and wound.  Neurological: Denies dizziness, seizures, syncope, weakness, light-headedness, numbness and headaches.  Hematological: Denies adenopathy. Easy bruising, personal or family bleeding history  Psychiatric/Behavioral: Denies suicidal ideation, mood changes, confusion, nervousness, sleep disturbance and agitation    Physical Exam: Vitals:   06/23/22 1056 06/23/22 1100  BP: (!) 190/120 (!) 182/90  Pulse: 64   Temp: 98.3 F (36.8 C)   TempSrc: Oral   SpO2: 98%   Weight: 181 lb 8 oz (82.3 kg)     Body mass index is 29.29 kg/m.    Constitutional: NAD, calm, comfortable Eyes: PERRL, lids and conjunctivae normal ENMT: Mucous membranes are moist.  Respiratory: clear to auscultation bilaterally, no wheezing, no crackles. Normal respiratory effort. No accessory muscle use.  Cardiovascular: Regular rate and rhythm, no murmurs / rubs / gallops.  No extremity edema.  Psychiatric: Normal judgment and insight. Alert and oriented x 3. Normal mood.    Impression and Plan:  Malignant hypertension - Plan: valsartan-hydrochlorothiazide (DIOVAN-HCT) 160-25 MG tablet, amLODipine (NORVASC) 5 MG tablet, CBC with Differential/Platelet, Comprehensive metabolic panel, Lipid panel, TSH  -Resume blood  pressure medication, she will do ambulatory measurements and return in 2 weeks for follow-up.  Check basic labs today. -Counseled on importance of adherence to medical therapy.  Time spent:32 minutes reviewing chart, interviewing and examining patient and formulating plan of care.    Chaya Jan, MD [facility]

## 2022-06-25 ENCOUNTER — Telehealth: Payer: Self-pay | Admitting: Internal Medicine

## 2022-06-25 ENCOUNTER — Encounter: Payer: Self-pay | Admitting: Internal Medicine

## 2022-06-25 ENCOUNTER — Other Ambulatory Visit: Payer: Self-pay | Admitting: Internal Medicine

## 2022-06-25 DIAGNOSIS — E782 Mixed hyperlipidemia: Secondary | ICD-10-CM

## 2022-06-25 DIAGNOSIS — E785 Hyperlipidemia, unspecified: Secondary | ICD-10-CM | POA: Insufficient documentation

## 2022-06-25 MED ORDER — ATORVASTATIN CALCIUM 40 MG PO TABS: 40.0000 mg | ORAL_TABLET | Freq: Every day | ORAL | 1 refills | Status: DC

## 2022-06-25 NOTE — Telephone Encounter (Signed)
Pt requesting call to discuss labs

## 2022-06-30 NOTE — Telephone Encounter (Signed)
Pt VM is full.

## 2022-08-21 IMAGING — CT CT HEAD W/O CM
4 series · 17 of 47 positions shown, 19 images · non-contrast
Comparison: June 18, 2017.

CLINICAL DATA: Possible stroke.



[Series 3: head without · axial · non-contrast · 0.43mm/px · z∈[-106,+19]mm · 7 of 35 slices shown, 9 images]
[im 5/35  brain]
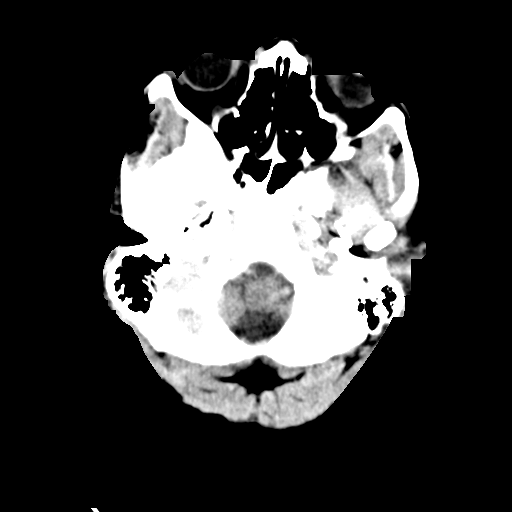
[im 5/35  bone]
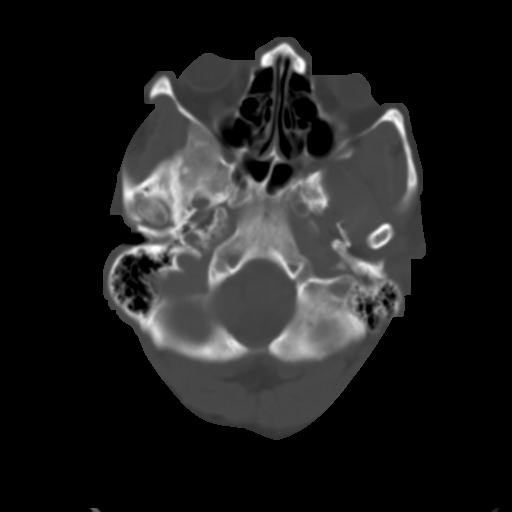
[im 9/35  brain]
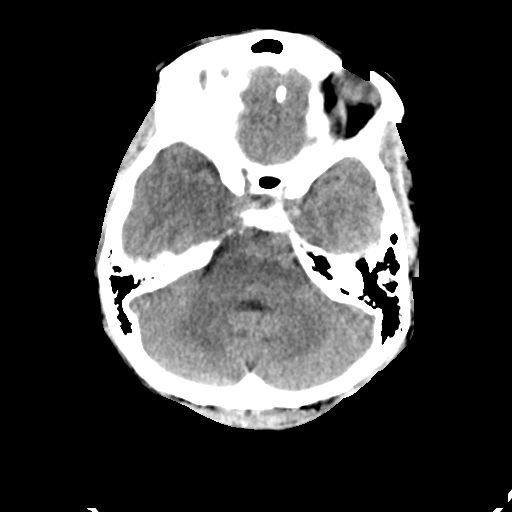
[im 13/35  brain]
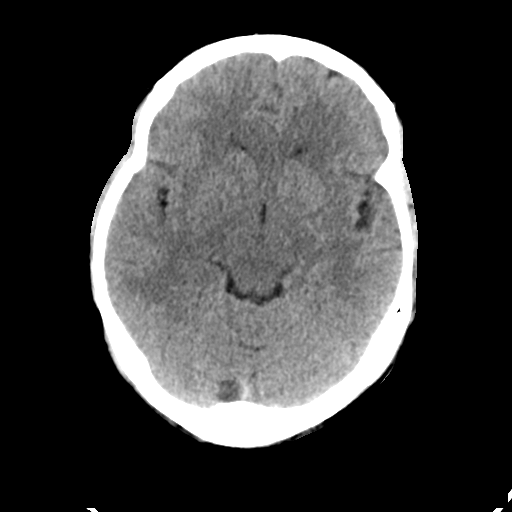
[im 18/35  brain]
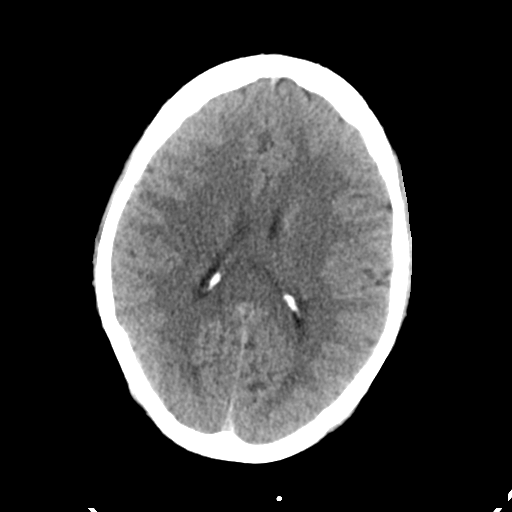
[im 22/35  brain]
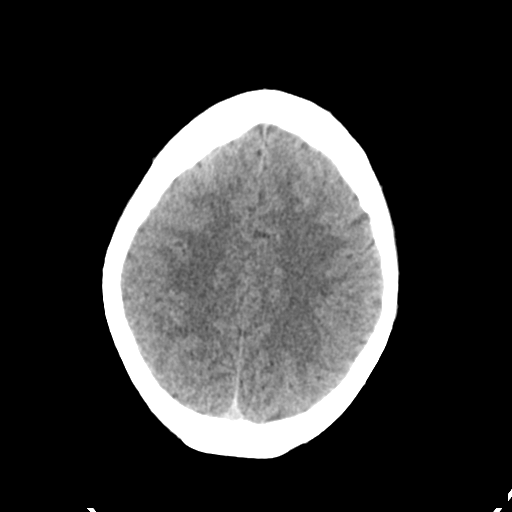
[im 22/35  bone]
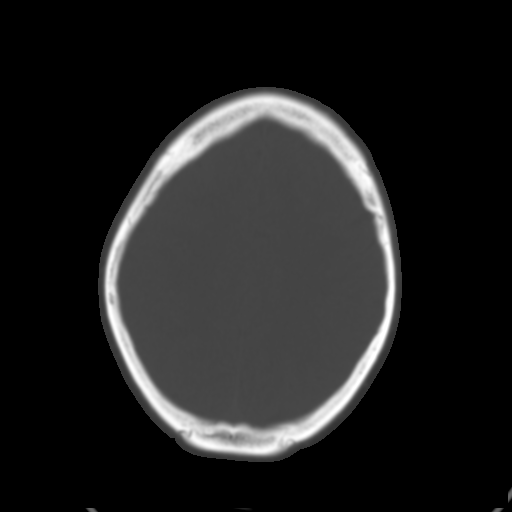
[im 26/35  brain]
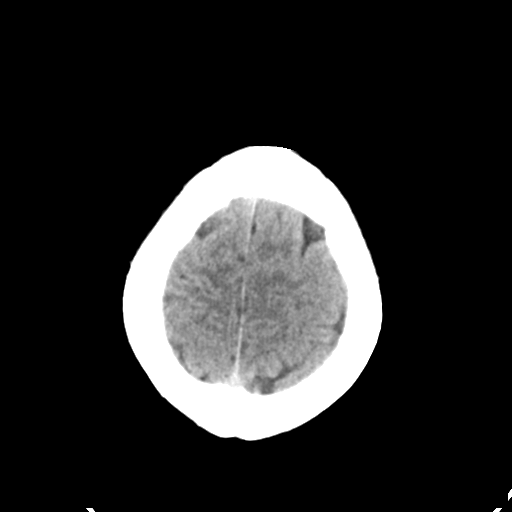
[im 30/35  brain]
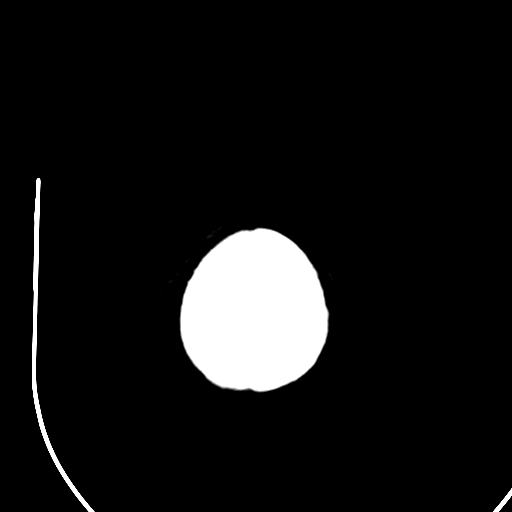

[Series 4: head bone · axial · 0.43mm/px · z∈[-110,-48]mm · 4 of 88 slices shown]
[im 9/88  bone]
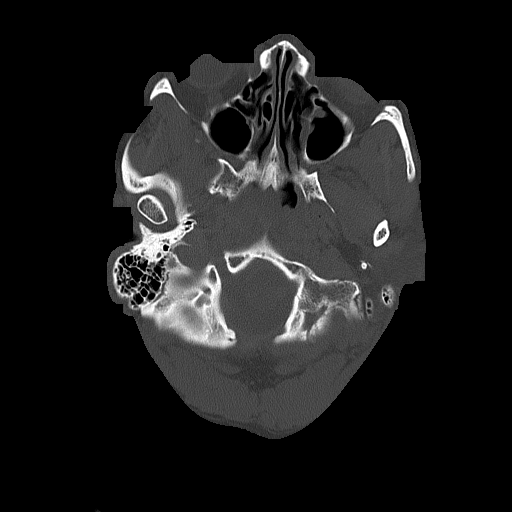
[im 18/88  bone]
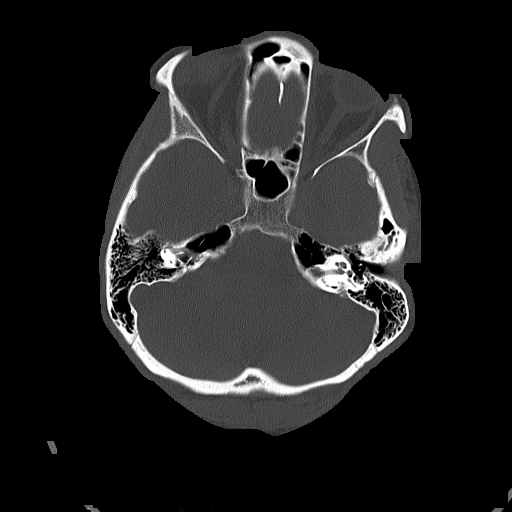
[im 27/88  bone]
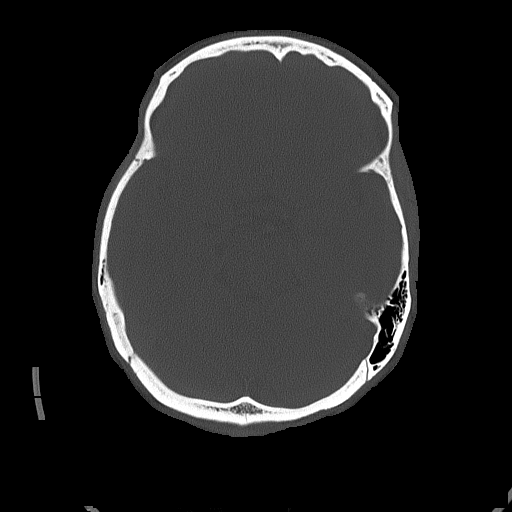
[im 40/88  bone]
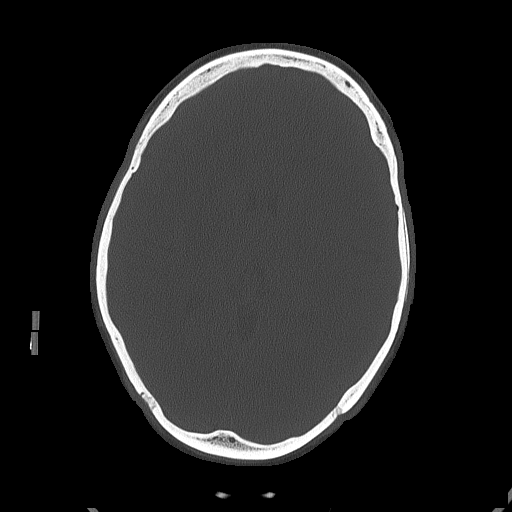

[Series 5: head without cor · coronal · non-contrast · 0.32mm/px · 3 of 67 slices shown]
[im 23/67  brain]
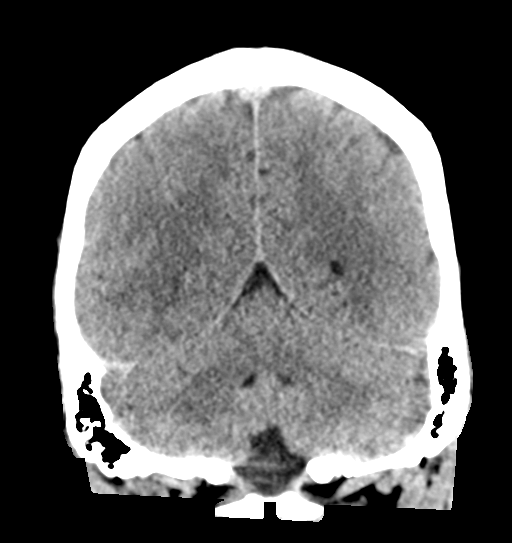
[im 30/67  brain]
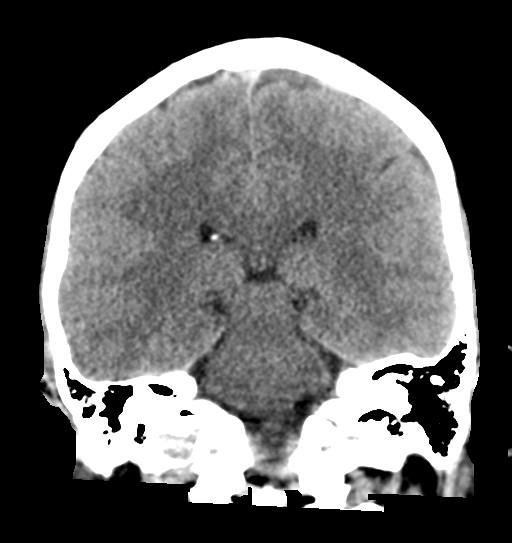
[im 37/67  brain]
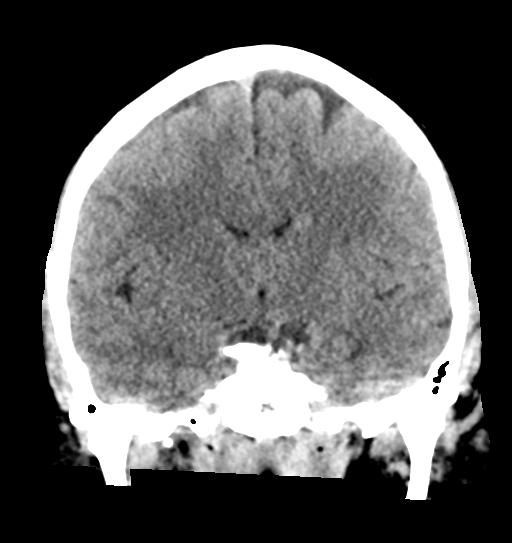

[Series 6: head without sag · sagittal · non-contrast · 0.34mm/px · 3 of 58 slices shown]
[im 20/58  brain]
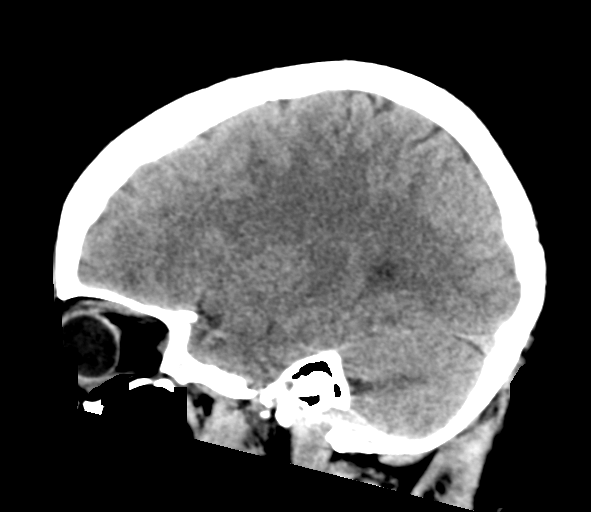
[im 29/58  brain]
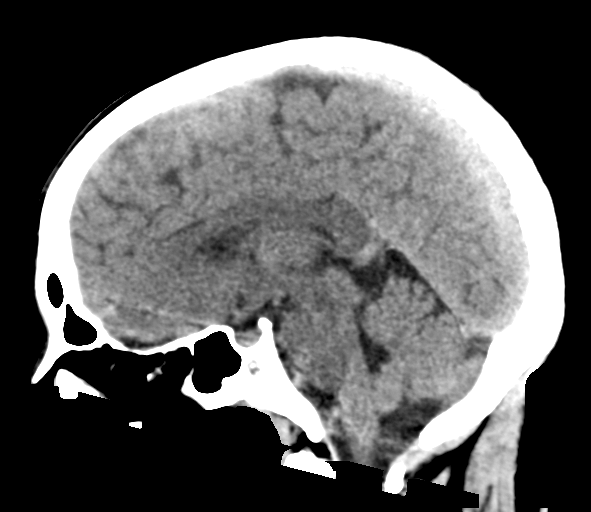
[im 39/58  brain]
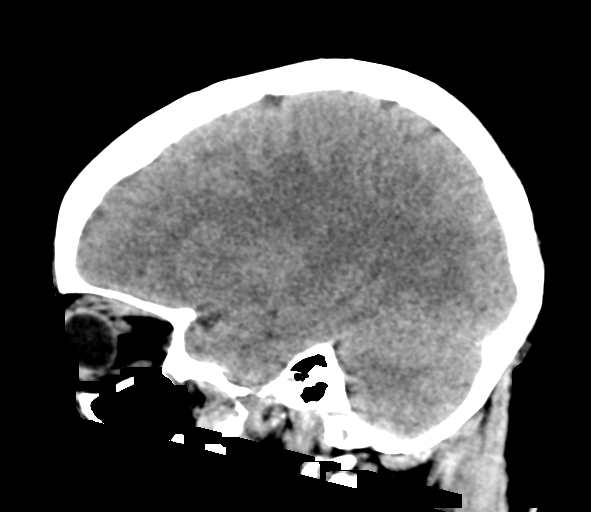

[17 of 47 positions shown; findings below may reference images not displayed]

FINDINGS: Brain: No evidence of acute infarction, hemorrhage, hydrocephalus,
extra-axial collection or mass lesion/mass effect.

Vascular: No hyperdense vessel or unexpected calcification.

Skull: Normal. Negative for fracture or focal lesion.

Sinuses/Orbits: No acute finding.

Other: None.
IMPRESSION: No acute intracranial abnormality seen.

## 2023-03-22 ENCOUNTER — Ambulatory Visit: Payer: 59 | Admitting: Internal Medicine

## 2023-03-22 ENCOUNTER — Encounter: Payer: Self-pay | Admitting: Internal Medicine

## 2023-03-22 VITALS — BP 192/128 | HR 75 | Temp 98.5°F | Wt 178.5 lb

## 2023-03-22 DIAGNOSIS — I10 Essential (primary) hypertension: Secondary | ICD-10-CM | POA: Diagnosis not present

## 2023-03-22 DIAGNOSIS — Z1211 Encounter for screening for malignant neoplasm of colon: Secondary | ICD-10-CM

## 2023-03-22 DIAGNOSIS — F1490 Cocaine use, unspecified, uncomplicated: Secondary | ICD-10-CM

## 2023-03-22 DIAGNOSIS — Z1231 Encounter for screening mammogram for malignant neoplasm of breast: Secondary | ICD-10-CM

## 2023-03-22 DIAGNOSIS — E782 Mixed hyperlipidemia: Secondary | ICD-10-CM

## 2023-03-22 DIAGNOSIS — N644 Mastodynia: Secondary | ICD-10-CM

## 2023-03-22 MED ORDER — ATORVASTATIN CALCIUM 40 MG PO TABS: 40.0000 mg | ORAL_TABLET | Freq: Every day | ORAL | 1 refills | Status: DC

## 2023-03-22 MED ORDER — VALSARTAN-HYDROCHLOROTHIAZIDE 160-25 MG PO TABS: 1.0000 | ORAL_TABLET | Freq: Every day | ORAL | 1 refills | Status: DC

## 2023-03-22 MED ORDER — AMLODIPINE BESYLATE 5 MG PO TABS: ORAL_TABLET | ORAL | 1 refills | Status: DC

## 2023-03-22 NOTE — Assessment & Plan Note (Signed)
Blood pressure is extremely elevated on today's visit, 2 separate measurements have been 180/120 and 182/128.  She has been nonadherent to medical therapy.  She denies recent cocaine use.

## 2023-03-22 NOTE — Progress Notes (Signed)
Established Patient Office Visit     CC/Reason for Visit: Follow-up chronic conditions, discuss acute concerns  HPI: [name] is a 47 y.o. female who is coming in today for the above mentioned reasons. Past Medical History is significant for: Malignant hypertension, hyperlipidemia, history of cocaine and alcohol use.  She states she has not used cocaine in some time.  Admits to being nonadherent to medical therapy.  Has been having headaches and hot flashes.  She is also been complaining of right breast pain, no nipple discharge, no appreciable lumps.   Past Medical/Surgical History: Past Medical History:  Diagnosis Date   Cocaine use    Hypertension    Malignant hypertension    Nicotine dependence     Past Surgical History:  Procedure Laterality Date   CESAREAN SECTION     x3   TUBAL LIGATION      Social History:  reports that she has been smoking cigarettes. She has been smoking an average of .5 packs per day. She has never used smokeless tobacco. She reports current alcohol use. She reports that she does not currently use drugs after having used the following drugs: Marijuana and Cocaine.  Allergies: Allergies  Allergen Reactions   Motrin [Ibuprofen] Nausea Only and Other (See Comments)    Stomach pain   Latex Itching and Rash    Family History:  Family History  Problem Relation Age of Onset   Cancer Father        throat and stomach   HIV Father    Breast cancer Paternal Grandmother    Hypertension Mother      Current Outpatient Medications:    amLODipine (NORVASC) 5 MG tablet, TAKE 1 TABLET(5 MG) BY MOUTH DAILY, Disp: 90 tablet, Rfl: 1   atorvastatin (LIPITOR) 40 MG tablet, Take 1 tablet (40 mg total) by mouth daily., Disp: 90 tablet, Rfl: 1   valsartan-hydrochlorothiazide (DIOVAN-HCT) 160-25 MG tablet, Take 1 tablet by mouth daily., Disp: 90 tablet, Rfl: 1  Review of Systems:  Negative unless indicated in HPI.   Physical Exam: Vitals:    03/22/23 0836 03/22/23 0841  BP: (!) 180/120 (!) 192/128  Pulse: 75   Temp: 98.5 F (36.9 C)   TempSrc: Oral   SpO2: 97%   Weight: 178 lb 8 oz (81 kg)     Body mass index is 28.81 kg/m.   Physical Exam Vitals reviewed.  Constitutional:      Appearance: Normal appearance.  HENT:     Head: Normocephalic and atraumatic.  Eyes:     Conjunctiva/sclera: Conjunctivae normal.     Pupils: Pupils are equal, round, and reactive to light.  Cardiovascular:     Rate and Rhythm: Normal rate and regular rhythm.  Pulmonary:     Effort: Pulmonary effort is normal.     Breath sounds: Normal breath sounds.  Chest:  Breasts:    Right: Normal.     Left: Normal.  Skin:    General: Skin is warm and dry.  Neurological:     General: No focal deficit present.     Mental Status: She is alert and oriented to person, place, and time.  Psychiatric:        Mood and Affect: Mood normal.        Behavior: Behavior normal.        Thought Content: Thought content normal.        Judgment: Judgment normal.      Impression and Plan:  Encounter  for screening mammogram for malignant neoplasm of breast -     Digital Screening Mammogram, Left and Right; Future  Screening for malignant neoplasm of colon -     Ambulatory referral to Gastroenterology  Malignant hypertension Assessment & Plan: Blood pressure is extremely elevated on today's visit, 2 separate measurements have been 180/120 and 182/128.  She has been nonadherent to medical therapy.  She denies recent cocaine use.  Orders: -     amLODIPine Besylate; TAKE 1 TABLET(5 MG) BY MOUTH DAILY  Dispense: 90 tablet; Refill: 1 -     Valsartan-hydroCHLOROthiazide; Take 1 tablet by mouth daily.  Dispense: 90 tablet; Refill: 1  Mixed hyperlipidemia Assessment & Plan: Nonadherent to cholesterol medication, resent statin prescription.  Orders: -     Atorvastatin Calcium; Take 1 tablet (40 mg total) by mouth daily.  Dispense: 90 tablet; Refill:  1  Cocaine use  Breast pain, right  -She is due for screening mammogram, order placed.  No masses appreciated on exam.   Time spent:30 minutes reviewing chart, interviewing and examining patient and formulating plan of care.  Significant time spent today on counseling regarding importance of adherence to medical therapy, in particular blood pressure.     Chaya Jan, MD [facility]

## 2023-03-22 NOTE — Assessment & Plan Note (Signed)
Nonadherent to cholesterol medication, resent statin prescription.

## 2023-03-30 ENCOUNTER — Encounter: Payer: Self-pay | Admitting: Internal Medicine

## 2023-03-31 ENCOUNTER — Telehealth: Payer: Self-pay | Admitting: Internal Medicine

## 2023-03-31 NOTE — Telephone Encounter (Signed)
Pt called in stating Dr. Ardyth Harps was going to have a referral for a mammogram placed. She hasn't received any calls to have that set up yet and wanted to check on the status. She wasn't sure if she needed to call to set up the mammogram or if someone would reach out to her. She would like a call back to discuss further.

## 2023-04-19 NOTE — Telephone Encounter (Signed)
Left message on machine for patient to return our call 

## 2023-04-20 NOTE — Telephone Encounter (Signed)
Left message on machine for patient to return our call 

## 2023-04-21 ENCOUNTER — Encounter: Payer: Self-pay | Admitting: Internal Medicine

## 2023-04-21 ENCOUNTER — Ambulatory Visit (AMBULATORY_SURGERY_CENTER): Payer: 59

## 2023-04-21 ENCOUNTER — Ambulatory Visit: Payer: 59 | Admitting: Internal Medicine

## 2023-04-21 VITALS — BP 188/126 | HR 75 | Temp 98.4°F | Ht 67.0 in | Wt 177.3 lb

## 2023-04-21 VITALS — Ht 67.0 in | Wt 177.0 lb

## 2023-04-21 DIAGNOSIS — I10 Essential (primary) hypertension: Secondary | ICD-10-CM | POA: Diagnosis not present

## 2023-04-21 DIAGNOSIS — Z1211 Encounter for screening for malignant neoplasm of colon: Secondary | ICD-10-CM

## 2023-04-21 MED ORDER — NA SULFATE-K SULFATE-MG SULF 17.5-3.13-1.6 GM/177ML PO SOLN: 1.0000 | Freq: Once | ORAL | 0 refills | Status: AC

## 2023-04-21 NOTE — Telephone Encounter (Signed)
Patient is aware 

## 2023-04-21 NOTE — Progress Notes (Signed)
Established Patient Office Visit     CC/Reason for Visit: Follow-up blood pressure  HPI: [name] is a 47 y.o. female who is coming in today for the above mentioned reasons. Past Medical History is significant for: Malignant hypertension.  Last visit in June she was restarted on amlodipine and valsartan/hydrochlorothiazide.  Unfortunately she has not been compliant.  She is having a lot of hot flashes and difficulty sleeping.   Past Medical/Surgical History: Past Medical History:  Diagnosis Date   Cocaine use    Hypertension    Malignant hypertension    Nicotine dependence     Past Surgical History:  Procedure Laterality Date   CESAREAN SECTION     x3   TUBAL LIGATION      Social History:  reports that she has been smoking cigarettes. She has been smoking an average of .5 packs per day. She has never used smokeless tobacco. She reports current alcohol use. She reports that she does not currently use drugs after having used the following drugs: Marijuana and Cocaine.  Allergies: Allergies  Allergen Reactions   Ibuprofen Nausea Only, Other (See Comments) and Nausea And Vomiting    Stomach pain   Latex Itching and Rash    Family History:  Family History  Problem Relation Age of Onset   Cancer Father        throat and stomach   HIV Father    Breast cancer Paternal Grandmother    Hypertension Mother      Current Outpatient Medications:    amLODipine (NORVASC) 5 MG tablet, TAKE 1 TABLET(5 MG) BY MOUTH DAILY, Disp: 90 tablet, Rfl: 1   atorvastatin (LIPITOR) 40 MG tablet, Take 1 tablet (40 mg total) by mouth daily., Disp: 90 tablet, Rfl: 1   valsartan-hydrochlorothiazide (DIOVAN-HCT) 160-25 MG tablet, Take 1 tablet by mouth daily., Disp: 90 tablet, Rfl: 1  Review of Systems:  Negative unless indicated in HPI.   Physical Exam: Vitals:   04/21/23 0850 04/21/23 0855  BP: (!) 190/130 (!) 188/126  Pulse: 75   Temp: 98.4 F (36.9 C)   TempSrc: Oral    SpO2: 99%   Weight: 177 lb 4.8 oz (80.4 kg)   Height: 5\' 7"  (1.702 m)     Body mass index is 27.77 kg/m.   Physical Exam Vitals reviewed.  Constitutional:      Appearance: Normal appearance.  HENT:     Head: Normocephalic and atraumatic.  Eyes:     Conjunctiva/sclera: Conjunctivae normal.     Pupils: Pupils are equal, round, and reactive to light.  Cardiovascular:     Rate and Rhythm: Normal rate and regular rhythm.  Pulmonary:     Effort: Pulmonary effort is normal.     Breath sounds: Normal breath sounds.  Skin:    General: Skin is warm and dry.  Neurological:     General: No focal deficit present.     Mental Status: She is alert and oriented to person, place, and time.  Psychiatric:        Mood and Affect: Mood normal.        Behavior: Behavior normal.        Thought Content: Thought content normal.        Judgment: Judgment normal.      Impression and Plan:  Malignant hypertension   -Counseled intensively today on importance of compliance with antihypertensive therapy as cardiovascular risk reduction.  I have helped her set an alarm on her phone  to remind her to take her medication every day.  I have also counseled her on importance of cocaine, tobacco and alcohol cessation.  I will see her back in 6 weeks for follow-up.  Time spent:23 minutes reviewing chart, interviewing and examining patient and formulating plan of care.     Chaya Jan, MD [facility]

## 2023-04-21 NOTE — Progress Notes (Signed)

## 2023-05-11 ENCOUNTER — Telehealth: Payer: Self-pay | Admitting: Internal Medicine

## 2023-05-11 NOTE — Telephone Encounter (Signed)
Patient called stated she tested positive for Covid 19.

## 2023-05-12 ENCOUNTER — Encounter: Payer: 59 | Admitting: Internal Medicine

## 2024-02-04 ENCOUNTER — Telehealth: Payer: Self-pay

## 2024-02-04 NOTE — Telephone Encounter (Signed)
 Copied from CRM [id number]. Topic: Clinical - Request for Lab/Test Order >> Feb 04, 2024 10:09 AM [name] wrote: Reason for CRM: Patient tried to schedule her mammography;however, she was informed that the order needed to be changed from screening to diagnostic due concerns with her breast.

## 2024-02-07 NOTE — Telephone Encounter (Signed)
 Would you like an office visit, or place orders?

## 2024-02-08 ENCOUNTER — Telehealth: Payer: Self-pay

## 2024-02-08 NOTE — Telephone Encounter (Signed)
Left message on machine for patient to schedule an office visit 

## 2024-02-08 NOTE — Telephone Encounter (Signed)
 Copied from CRM [id number]. Topic: General - Other >> Feb 08, 2024  8:57 AM Adonis Hoot wrote: Reason for CRM: Patient returned call to [facility] regarding mammography.She is requesting a phone call regarding needing an office visit for order.Patient stated that she has already discussed with provider the issues that she is having with her breast.

## 2024-02-08 NOTE — Telephone Encounter (Signed)
Patient is aware and an appointment scheduled

## 2024-02-10 ENCOUNTER — Ambulatory Visit: Admitting: Internal Medicine

## 2024-02-10 ENCOUNTER — Encounter: Payer: Self-pay | Admitting: Internal Medicine

## 2024-02-10 VITALS — BP 174/122 | HR 80 | Temp 98.7°F | Wt 177.4 lb

## 2024-02-10 DIAGNOSIS — N6311 Unspecified lump in the right breast, upper outer quadrant: Secondary | ICD-10-CM

## 2024-02-10 DIAGNOSIS — I10 Essential (primary) hypertension: Secondary | ICD-10-CM

## 2024-02-10 DIAGNOSIS — E782 Mixed hyperlipidemia: Secondary | ICD-10-CM

## 2024-02-10 DIAGNOSIS — F1490 Cocaine use, unspecified, uncomplicated: Secondary | ICD-10-CM

## 2024-02-10 DIAGNOSIS — N911 Secondary amenorrhea: Secondary | ICD-10-CM

## 2024-02-10 DIAGNOSIS — F17210 Nicotine dependence, cigarettes, uncomplicated: Secondary | ICD-10-CM | POA: Diagnosis not present

## 2024-02-10 LAB — LIPID PANEL
Cholesterol: 250 mg/dL — ABNORMAL HIGH (ref 0–200)
HDL: 76.3 mg/dL (ref >39.00)
LDL Cholesterol: 149 mg/dL — ABNORMAL HIGH (ref 0–99)
NonHDL: 173.56
Total CHOL/HDL Ratio: 3
Triglycerides: 125 mg/dL (ref 0.0–149.0)
VLDL: 25 mg/dL (ref 0.0–40.0)

## 2024-02-10 LAB — COMPREHENSIVE METABOLIC PANEL WITH GFR
ALT: 17 U/L (ref 0–35)
AST: 20 U/L (ref 0–37)
Albumin: 4.4 g/dL (ref 3.5–5.2)
Alkaline Phosphatase: 67 U/L (ref 39–117)
BUN: 13 mg/dL (ref 6–23)
CO2: 28 meq/L (ref 19–32)
Calcium: 9.2 mg/dL (ref 8.4–10.5)
Chloride: 102 meq/L (ref 96–112)
Creatinine, Ser: 0.68 mg/dL (ref 0.40–1.20)
GFR: 104.8 mL/min (ref >60.00)
Glucose, Bld: 96 mg/dL (ref 70–99)
Potassium: 3.4 meq/L — ABNORMAL LOW (ref 3.5–5.1)
Sodium: 138 meq/L (ref 135–145)
Total Bilirubin: 0.5 mg/dL (ref 0.2–1.2)
Total Protein: 7.5 g/dL (ref 6.0–8.3)

## 2024-02-10 LAB — CBC WITH DIFFERENTIAL/PLATELET
Basophils Absolute: 0.1 10*3/uL (ref 0.0–0.1)
Basophils Relative: 1.2 % (ref 0.0–3.0)
Eosinophils Absolute: 0.1 10*3/uL (ref 0.0–0.7)
Eosinophils Relative: 2 % (ref 0.0–5.0)
HCT: 38.2 % (ref 36.0–46.0)
Hemoglobin: 12.9 g/dL (ref 12.0–15.0)
Lymphocytes Relative: 27.3 % (ref 12.0–46.0)
Lymphs Abs: 1.6 10*3/uL (ref 0.7–4.0)
MCHC: 33.7 g/dL (ref 30.0–36.0)
MCV: 95.6 fl (ref 78.0–100.0)
Monocytes Absolute: 0.4 10*3/uL (ref 0.1–1.0)
Monocytes Relative: 6.2 % (ref 3.0–12.0)
Neutro Abs: 3.7 10*3/uL (ref 1.4–7.7)
Neutrophils Relative %: 63.3 % (ref 43.0–77.0)
Platelets: 277 10*3/uL (ref 150.0–400.0)
RBC: 4 Mil/uL (ref 3.87–5.11)
RDW: 14.5 % (ref 11.5–15.5)
WBC: 5.8 10*3/uL (ref 4.0–10.5)

## 2024-02-10 LAB — POCT URINE PREGNANCY: Preg Test, Ur: NEGATIVE

## 2024-02-10 MED ORDER — VALSARTAN-HYDROCHLOROTHIAZIDE 160-25 MG PO TABS: 1.0000 | ORAL_TABLET | Freq: Every day | ORAL | 1 refills | Status: AC

## 2024-02-10 MED ORDER — ATORVASTATIN CALCIUM 40 MG PO TABS: 40.0000 mg | ORAL_TABLET | Freq: Every day | ORAL | 1 refills | Status: AC

## 2024-02-10 MED ORDER — AMLODIPINE BESYLATE 5 MG PO TABS: ORAL_TABLET | ORAL | 1 refills | Status: AC

## 2024-02-10 NOTE — Progress Notes (Signed)
 Established Patient Office Visit     CC/Reason for Visit: Right breast discomfort  HPI: [name] is a 48 y.o. female who is coming in today for the above mentioned reasons. Past Medical History is significant for: Malignant hypertension.  I have not seen her in close to a year.  Unfortunately she is still not compliant with blood pressure medication.  2 in office blood pressures are 180/126 and 174/122.  Denies headache, shortness of breath or focal neurologic deficits.  Her main complaint today is discomfort of her right breast.  She has a lump as well as an enlarged nipple that is painful.  She also mentions that her last menstrual period was in May 2024 and today she has been having heavy bleeding and cramping.   Past Medical/Surgical History: Past Medical History:  Diagnosis Date   Cocaine use    Hypertension    Malignant hypertension    Nicotine dependence     Past Surgical History:  Procedure Laterality Date   CESAREAN SECTION     x3   TUBAL LIGATION      Social History:  reports that she has been smoking cigarettes. She has never used smokeless tobacco. She reports current alcohol use. She reports that she does not currently use drugs after having used the following drugs: Marijuana and Cocaine.  Allergies: Allergies  Allergen Reactions   Ibuprofen  Nausea Only, Other (See Comments) and Nausea And Vomiting    Stomach pain   Latex Itching and Rash    Family History:  Family History  Problem Relation Age of Onset   Hypertension Mother    Cancer Father        throat and stomach   HIV Father    Breast cancer Paternal Grandmother    Colon cancer Neg Hx    Colon polyps Neg Hx    Esophageal cancer Neg Hx    Rectal cancer Neg Hx    Stomach cancer Neg Hx      Current Outpatient Medications:    amLODipine  (NORVASC ) 5 MG tablet, TAKE 1 TABLET(5 MG) BY MOUTH DAILY, Disp: 90 tablet, Rfl: 1   atorvastatin  (LIPITOR) 40 MG tablet, Take 1 tablet (40 mg  total) by mouth daily., Disp: 90 tablet, Rfl: 1   valsartan -hydrochlorothiazide  (DIOVAN -HCT) 160-25 MG tablet, Take 1 tablet by mouth daily., Disp: 90 tablet, Rfl: 1  Review of Systems:  Negative unless indicated in HPI.   Physical Exam: Vitals:   02/10/24 0704 02/10/24 0709  BP: (!) 180/120 (!) 174/122  Pulse: 80   Temp: 98.7 F (37.1 C)   TempSrc: Oral   SpO2: 99%   Weight: 177 lb 6.4 oz (80.5 kg)     Body mass index is 27.78 kg/m.   Physical Exam Vitals reviewed.  Constitutional:      Appearance: Normal appearance.  HENT:     Head: Normocephalic and atraumatic.  Eyes:     Conjunctiva/sclera: Conjunctivae normal.  Cardiovascular:     Rate and Rhythm: Normal rate and regular rhythm.  Pulmonary:     Effort: Pulmonary effort is normal.     Breath sounds: Normal breath sounds.  Chest:       Comments: Right breast has about a 2 cm mass in the 10 o'clock position with a large everted nipple compared to the left. Skin:    General: Skin is warm and dry.  Neurological:     General: No focal deficit present.     Mental Status: She  is alert and oriented to person, place, and time.  Psychiatric:        Mood and Affect: Mood normal.        Behavior: Behavior normal.        Thought Content: Thought content normal.        Judgment: Judgment normal.      Impression and Plan:  Cocaine use  Malignant hypertension -     amLODIPine  Besylate; TAKE 1 TABLET(5 MG) BY MOUTH DAILY  Dispense: 90 tablet; Refill: 1 -     Valsartan -hydroCHLOROthiazide ; Take 1 tablet by mouth daily.  Dispense: 90 tablet; Refill: 1 -     CBC with Differential/Platelet; Future -     Comprehensive metabolic panel with GFR; Future  Mixed hyperlipidemia -     Atorvastatin  Calcium ; Take 1 tablet (40 mg total) by mouth daily.  Dispense: 90 tablet; Refill: 1 -     Lipid panel; Future  Cigarette nicotine dependence without complication  Amenorrhea, secondary -     POCT urine pregnancy -     hCG,  serum, qualitative  Breast lump on right side at 10 o'clock position -     MM Digital Diagnostic Unilat R; Future -     US  BREAST COMPLETE UNI RIGHT INC AXILLA; Future -     Digital Screening Mammogram, Left and Right; Future   - On exam she does have a right breast lump at the 10 o'clock position.  This coupled with her enlarged nipple is concerning.  Will send for diagnostic imaging. - In office urine pregnancy test is negative. - She promises to be adherent to blood pressure regimen.  Will start amlodipine  and valsartan  HCT and have her return in 2 weeks for follow-up.  If no improvement then we will consider sending to the advanced hypertension clinic. - Check labs today for endorgan damage.  Time spent:34 minutes reviewing chart, interviewing and examining patient and formulating plan of care.     Marguerita Shih, MD [facility]

## 2024-02-11 ENCOUNTER — Encounter: Payer: Self-pay | Admitting: Internal Medicine

## 2024-02-11 ENCOUNTER — Telehealth: Payer: Self-pay

## 2024-02-11 LAB — HCG, SERUM, QUALITATIVE: Preg, Serum: NEGATIVE

## 2024-02-11 NOTE — Telephone Encounter (Signed)
 Copied from CRM [id number]. Topic: Referral - Question >> Feb 11, 2024  8:30 AM Bambi Bonine D wrote: Reason for CRM: Patient stated that she was referred to [facility] to have an ultrasound completed. Patient stated that they don't have anything available until 5/30 for appointments and she doesn't want to wait that late. Patient stated that she would like to switch the referral to [facility] center in [place] because they have sooner availability. Fax Number is [phone] and patient would like to receive a call when the referral has been sent.

## 2024-02-14 NOTE — Telephone Encounter (Signed)
 Orders have been faxed and confirmed.  Attempted to call the patient, but had a bad connection.

## 2024-02-14 NOTE — Telephone Encounter (Signed)
 Left detailed message on machine informing patient that the orders have been faxed and confirmed.

## 2024-02-14 NOTE — Telephone Encounter (Signed)
 Fax number where the order needs to go to is [phone] she would like a call back the order has been faxed over

## 2024-02-15 ENCOUNTER — Encounter: Payer: Self-pay | Admitting: Internal Medicine

## 2024-02-15 ENCOUNTER — Other Ambulatory Visit: Payer: Self-pay | Admitting: Internal Medicine

## 2024-02-15 DIAGNOSIS — E876 Hypokalemia: Secondary | ICD-10-CM

## 2024-02-15 MED ORDER — POTASSIUM CHLORIDE CRYS ER 20 MEQ PO TBCR: 20.0000 meq | EXTENDED_RELEASE_TABLET | Freq: Every day | ORAL | 0 refills | Status: AC

## 2024-02-21 ENCOUNTER — Ambulatory Visit: Admitting: Internal Medicine

## 2024-03-04 ENCOUNTER — Other Ambulatory Visit: Payer: Self-pay | Admitting: Medical Genetics

## 2024-03-09 ENCOUNTER — Telehealth: Payer: Self-pay | Admitting: *Deleted

## 2024-03-09 NOTE — Telephone Encounter (Signed)
 Patient is aware.  Patient will call where she had the biopsy done and have them fax the result to Dr Ival Marines

## 2024-03-09 NOTE — Telephone Encounter (Signed)
 Copied from CRM [id number]. Topic: Clinical - Lab/Test Results >> Mar 09, 2024  9:04 AM Jenice Mitts wrote: Reason for CRM: Patient is calling because she has been nervous about her test results  She would like a call going over them or if not can they be loaded in mychart while she waits on a call

## 2024-03-09 NOTE — Telephone Encounter (Signed)
 Copied from CRM [id number]. Topic: General - Other >> Mar 09, 2024  3:55 PM Freya Jesus wrote: Reason for CRM: Naomi Bach from [facility] called said patient has been calling her non-stop  regarding lab results. Shelvy Dickens stated patient has not been seen by her or any other provider in the hospital. She would like for us  to advised patient.  Call back: [phone]

## 2024-03-10 ENCOUNTER — Encounter

## 2024-03-10 ENCOUNTER — Other Ambulatory Visit

## 2024-03-10 ENCOUNTER — Other Ambulatory Visit (HOSPITAL_COMMUNITY): Payer: Self-pay

## 2024-08-03 ENCOUNTER — Other Ambulatory Visit: Payer: Self-pay | Admitting: Medical Genetics

## 2024-08-03 DIAGNOSIS — Z006 Encounter for examination for normal comparison and control in clinical research program: Secondary | ICD-10-CM

## 2024-11-15 ENCOUNTER — Ambulatory Visit: Admitting: Podiatry
